# Patient Record
Sex: Male | Born: 1985 | Race: White | Hispanic: No | Marital: Married | Smoking: Former smoker
Health system: Southern US, Community
[De-identification: ages and names within clinical notes are randomized; demographics above are authoritative.]

---
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
## 2011-08-06 ENCOUNTER — Ambulatory Visit
Admission: RE | Admit: 2011-08-06 | Discharge: 2011-08-06 | Disposition: A | Discharge status: Home / Self Care | Payer: Federal, State, Local not specified - PPO | Source: Ambulatory Visit | Attending: Family Medicine | Admitting: Family Medicine

## 2011-08-06 ENCOUNTER — Other Ambulatory Visit: Payer: Self-pay | Admitting: Family Medicine

## 2011-08-06 DIAGNOSIS — R519 Headache, unspecified: Secondary | ICD-10-CM

## 2011-08-06 MED ORDER — GADOBENATE DIMEGLUMINE 529 MG/ML IV SOLN
15.0000 mL | Freq: Once | INTRAVENOUS | Status: AC | PRN
  Administered 2011-08-06: 15 mL via INTRAVENOUS

## 2014-03-15 ENCOUNTER — Ambulatory Visit
Admission: RE | Admit: 2014-03-15 | Discharge: 2014-03-15 | Disposition: A | Discharge status: Home / Self Care | Payer: Federal, State, Local not specified - PPO | Source: Ambulatory Visit | Attending: Family Medicine | Admitting: Family Medicine

## 2014-03-15 ENCOUNTER — Other Ambulatory Visit: Payer: Self-pay | Admitting: Family Medicine

## 2014-03-15 DIAGNOSIS — M549 Dorsalgia, unspecified: Secondary | ICD-10-CM

## 2015-06-15 ENCOUNTER — Ambulatory Visit
Admission: RE | Admit: 2015-06-15 | Discharge: 2015-06-15 | Disposition: A | Discharge status: Home / Self Care | Payer: Federal, State, Local not specified - PPO | Source: Ambulatory Visit | Attending: Family Medicine | Admitting: Family Medicine

## 2015-06-15 ENCOUNTER — Other Ambulatory Visit: Payer: Self-pay | Admitting: Family Medicine

## 2015-06-15 DIAGNOSIS — R52 Pain, unspecified: Secondary | ICD-10-CM

## 2015-06-15 DIAGNOSIS — M79632 Pain in left forearm: Secondary | ICD-10-CM

## 2016-08-29 ENCOUNTER — Other Ambulatory Visit: Payer: Self-pay | Admitting: Family Medicine

## 2016-08-29 ENCOUNTER — Ambulatory Visit
Admission: RE | Admit: 2016-08-29 | Discharge: 2016-08-29 | Disposition: A | Discharge status: Home / Self Care | Payer: Managed Care, Other (non HMO) | Source: Ambulatory Visit | Attending: Family Medicine | Admitting: Family Medicine

## 2016-08-29 DIAGNOSIS — R0602 Shortness of breath: Secondary | ICD-10-CM

## 2016-10-17 ENCOUNTER — Ambulatory Visit (INDEPENDENT_AMBULATORY_CARE_PROVIDER_SITE_OTHER): Payer: Managed Care, Other (non HMO) | Admitting: Internal Medicine

## 2016-10-17 ENCOUNTER — Encounter: Payer: Self-pay | Admitting: Internal Medicine

## 2016-10-17 ENCOUNTER — Other Ambulatory Visit (INDEPENDENT_AMBULATORY_CARE_PROVIDER_SITE_OTHER): Payer: Managed Care, Other (non HMO)

## 2016-10-17 VITALS — BP 112/70 | HR 78 | Ht 71.0 in | Wt 157.8 lb

## 2016-10-17 DIAGNOSIS — J45991 Cough variant asthma: Secondary | ICD-10-CM | POA: Diagnosis not present

## 2016-10-17 LAB — CBC WITH DIFFERENTIAL/PLATELET
BASOS ABS: 0 10*3/uL (ref 0.0–0.1)
Basophils Relative: 0.6 % (ref 0.0–3.0)
EOS ABS: 0.4 10*3/uL (ref 0.0–0.7)
Eosinophils Relative: 6.8 % — ABNORMAL HIGH (ref 0.0–5.0)
HEMATOCRIT: 46.7 % (ref 39.0–52.0)
Hemoglobin: 15.8 g/dL (ref 13.0–17.0)
Lymphocytes Relative: 40.9 % (ref 12.0–46.0)
Lymphs Abs: 2.6 10*3/uL (ref 0.7–4.0)
MCHC: 33.9 g/dL (ref 30.0–36.0)
MCV: 88.9 fl (ref 78.0–100.0)
MONOS PCT: 7.4 % (ref 3.0–12.0)
Monocytes Absolute: 0.5 10*3/uL (ref 0.1–1.0)
NEUTROS ABS: 2.8 10*3/uL (ref 1.4–7.7)
Neutrophils Relative %: 44.3 % (ref 43.0–77.0)
PLATELETS: 220 10*3/uL (ref 150.0–400.0)
RBC: 5.25 Mil/uL (ref 4.22–5.81)
RDW: 12.4 % (ref 11.5–15.5)
WBC: 6.3 10*3/uL (ref 4.0–10.5)

## 2016-10-17 LAB — NITRIC OXIDE: Nitric Oxide: 135

## 2016-10-17 MED ORDER — BUDESONIDE-FORMOTEROL FUMARATE 160-4.5 MCG/ACT IN AERO: 2.0000 | INHALATION_SPRAY | Freq: Two times a day (BID) | RESPIRATORY_TRACT | 11 refills | Status: DC

## 2016-10-17 MED ORDER — ALBUTEROL SULFATE HFA 108 (90 BASE) MCG/ACT IN AERS: INHALATION_SPRAY | RESPIRATORY_TRACT | 1 refills | Status: DC

## 2016-10-17 NOTE — Progress Notes (Signed)
   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 Pulmonary Consult    Referred by [name], PA.  Pt c/o SOB off and on since Aug 2017- sometimes wakes up in the night feeling SOB.  He has also noticed a decline in his exercise tolerance. He has occ cough with green sputum.    sob onset was insidious summer of 2017 noted with chasing kids around playing laser tag  Better p brother's inhaler when wakes up at night with spells of noct cough/sob > mucus variably sltly green   No obvious other patterns in day to day or daytime variabilty or assoc chronic cough or cp or chest tightness, subjective wheeze overt hb symptoms. No unusual exp hx or h/o childhood pna/ asthma or knowledge of premature birth.  Also denies any obvious fluctuation of symptoms with weather or environmental changes or other aggravating or alleviating factors except as outlined above   Current Medications, Allergies, Complete Past Medical History, Past Surgical History, Family History, and Social History were reviewed in [facility] electronic medical record.              Review of Systems  Constitutional: Negative for activity change, appetite change, chills, fever and unexpected weight change.  HENT: Negative for congestion, dental problem, postnasal drip, rhinorrhea, sneezing, sore throat, trouble swallowing and voice change.   Eyes: Negative for visual disturbance.  Respiratory: Positive for cough and shortness of breath. Negative for choking.   Cardiovascular: Negative for chest  pain and leg swelling.  Gastrointestinal: Negative for abdominal pain, nausea and vomiting.  Genitourinary: Negative for difficulty urinating.  Musculoskeletal: Negative for arthralgias.  Skin: Negative for rash.  Psychiatric/Behavioral: Negative for behavioral problems and confusion.       Objective:   Physical Exam  Pleasant amb wm nad  Wt Readings from Last 3 Encounters:  10/17/16 157 lb 12.8 oz (71.6 kg)    Vital signs reviewed - Note on arrival 02 sats  98% on RA    HEENT: nl dentition, and oropharynx. Nl external ear canals without cough reflex- moderate bilateral non-specific turbinate edema     NECK :  without JVD/Nodes/TM/ nl carotid upstrokes bilaterally   LUNGS: no acc muscle use,  Nl contour chest which is clear to A and P bilaterally without cough on insp or exp maneuvers   CV:  RRR  no s3 or murmur or increase in P2, nad no edema   ABD:  soft and nontender with nl inspiratory excursion in the supine position. No bruits or organomegaly appreciated, bowel sounds nl  MS:  Nl gait/ ext warm without deformities, calf tenderness, cyanosis or clubbing No obvious joint restrictions   SKIN: warm and dry without lesions    NEURO:  alert, approp, nl sensorium with  no motor or cerebellar deficits apparent.     Labs ordered 10/17/2016   Allergy profile       Assessment & Plan:

## 2016-10-17 NOTE — Patient Instructions (Addendum)
Please remember to go to the lab  department downstairs for your tests - we will call you with the results when they are available.  Plan A = Automatic = symbicort 160 Take 2 puffs first thing in am and then another 2 puffs about 12 hours later.   Work on inhaler technique:  relax and gently blow all the way out then take a nice smooth deep breath back in, triggering the inhaler at same time you start breathing in.  Hold for up to 5 seconds if you can. Blow out thru nose. Rinse and gargle with water when done      Plan B = Backup Only use your albuterol as a rescue medication to be used if you can't catch your breath by resting or doing a relaxed purse lip breathing pattern.  - The less you use it, the better it will work when you need it. - Ok to use the inhaler up to 2 puffs  every 4 hours if you must but call for appointment if use goes up over your usual need - Don't leave home without it !!  (think of it like the spare tire for your car)     Please schedule a follow up office visit in 6 weeks, call sooner if needed

## 2016-10-17 NOTE — Assessment & Plan Note (Addendum)
-   FENO 10/17/2016  =   135  - Allergy profile 10/17/2016 >  Eos 0. 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  Please see AVS from this visit for a full list of these instructions

## 2016-10-18 LAB — RESPIRATORY ALLERGY PROFILE REGION II ~~LOC~~
ALLERGEN, COMM SILVER BIRCH, T3: 0.11 kU/L — AB
ALLERGEN, MULBERRY, T70: 0.13 kU/L — AB
Allergen, A. alternata, m6: <0.1 kU/L
Allergen, C. Herbarum, M2: <0.1 kU/L
Allergen, Cottonwood, t14: 0.2 kU/L — ABNORMAL HIGH
Allergen, D pternoyssinus,d7: 3.96 kU/L — ABNORMAL HIGH
Allergen, Oak,t7: 0.44 kU/L — ABNORMAL HIGH
Allergen, P. notatum, m1: <0.1 kU/L
Aspergillus fumigatus, m3: <0.1 kU/L
Bermuda Grass: <0.1 kU/L
Box Elder IgE: 0.16 kU/L — ABNORMAL HIGH
Cat Dander: 10.8 kU/L — ABNORMAL HIGH
Cockroach: <0.1 kU/L
Common Ragweed: <0.1 kU/L
D. farinae: 5.11 kU/L — ABNORMAL HIGH
DOG DANDER: 3.49 kU/L — AB
ELM IGE: 0.18 kU/L — AB
IGE (IMMUNOGLOBULIN E), SERUM: 162 kU/L — AB (ref <115)
Johnson Grass: <0.1 kU/L
Pecan/Hickory Tree IgE: 9.44 kU/L — ABNORMAL HIGH
Rough Pigweed  IgE: <0.1 kU/L
Sheep Sorrel IgE: 0.18 kU/L — ABNORMAL HIGH
Timothy Grass: <0.1 kU/L

## 2016-10-19 ENCOUNTER — Telehealth: Payer: Self-pay | Admitting: Internal Medicine

## 2016-10-19 NOTE — Progress Notes (Signed)
LMTCB

## 2016-10-19 NOTE — Telephone Encounter (Signed)
Nyoka CowdenMichael B Wert, MD sent to Christen ButterLeslie M Patsy Varma, CMA        Call patient : Studies are c/w multiple allergies esp dust, cat dog, trees. Be sure patient has f/u ov so we can go over all the details of this study and get a plan together moving forward - ok to move up f/u if not feeling better and wants to be seen sooner     Spoke with pt and notified of results per Dr. Sherene SiresWert. Pt verbalized understanding and denied any questions.

## 2016-10-19 NOTE — Progress Notes (Signed)
Spoke with pt and notified of results per Dr. Wert. Pt verbalized understanding and denied any questions. 

## 2016-10-25 ENCOUNTER — Telehealth: Payer: Self-pay | Admitting: Internal Medicine

## 2016-10-25 NOTE — Telephone Encounter (Signed)
PA initiated in cover my meds for the symbicort  Key  [id number] Name   Shawn Marquez DOB  26-Oct-1986  Will forward to leslie to follow up on PA.

## 2016-10-31 MED ORDER — FLUTICASONE-SALMETEROL 115-21 MCG/ACT IN AERO: 2.0000 | INHALATION_SPRAY | Freq: Two times a day (BID) | RESPIRATORY_TRACT | 12 refills | Status: DC

## 2016-10-31 MED ORDER — FLUTICASONE-SALMETEROL 115-21 MCG/ACT IN AERO: 2.0000 | INHALATION_SPRAY | Freq: Two times a day (BID) | RESPIRATORY_TRACT | 0 refills | Status: DC

## 2016-10-31 NOTE — Telephone Encounter (Signed)
Spoke with pt, aware of rx change.  Sample left up front, rx sent to pharmacy.  Nothing further needed.

## 2016-10-31 NOTE — Telephone Encounter (Signed)
Received fax stating that the Symbicort is not covered  Per MW- switch to Advair HFA 115 mg 2 puffs bid  LMTCB

## 2016-10-31 NOTE — Telephone Encounter (Signed)
Pt retuening call.Shawn Marquez ° °

## 2016-11-30 ENCOUNTER — Ambulatory Visit: Payer: Managed Care, Other (non HMO) | Admitting: Internal Medicine

## 2016-12-03 ENCOUNTER — Ambulatory Visit: Payer: Managed Care, Other (non HMO) | Admitting: Internal Medicine

## 2016-12-12 ENCOUNTER — Encounter: Payer: Self-pay | Admitting: Internal Medicine

## 2016-12-12 ENCOUNTER — Ambulatory Visit (INDEPENDENT_AMBULATORY_CARE_PROVIDER_SITE_OTHER): Payer: Managed Care, Other (non HMO) | Admitting: Internal Medicine

## 2016-12-12 VITALS — BP 120/70 | HR 83 | Ht 71.0 in | Wt 156.2 lb

## 2016-12-12 DIAGNOSIS — J45991 Cough variant asthma: Secondary | ICD-10-CM

## 2016-12-12 MED ORDER — BUDESONIDE-FORMOTEROL FUMARATE 160-4.5 MCG/ACT IN AERO: INHALATION_SPRAY | RESPIRATORY_TRACT | 11 refills | Status: DC

## 2016-12-12 NOTE — Assessment & Plan Note (Addendum)
-  FENO 10/17/2016  =  135  - Allergy profile 10/17/2016 >  Eos 0.4 /  IgE  162 pos dust cat > dog, trees  - Spirometry 10/17/2016  FEV1 3.61 (78%)  Ratio 68  - 10/17/2016  After extensive coaching HFA effectiveness =    90% > symbicort 160 2bid  > symptoms   Despite very high initial FENO and elevated IgE and Eos >> all  goals of chronic asthma control met including optimal function and elimination of symptoms with minimal need for rescue therapy.  Contingencies discussed in full including contacting this office immediately if not controlling the symptoms using the rule of two's.     After allergy season ok to drop the symbicort back to 160 x 2 each am   I had an extended discussion with the patient reviewing all relevant studies completed to date and  lasting 15 to 20 minutes of a 25 minute visit    Formulary restrictions will be an ongoing challenge for the forseable future and I would be happy to pick an alternative if the pt will first  provide me a list of them but pt  will need to return here for training for any new device that is required eg dpi vs hfa vs respimat.    In meantime we can always provide samples so the patient never runs out of any needed respiratory medications.   Each maintenance medication was reviewed in detail including most importantly the difference between maintenance and prns and under what circumstances the prns are to be triggered using an action plan format that is not reflected in the computer generated alphabetically organized AVS.    Please see AVS for specific instructions unique to this visit that I personally wrote and verbalized to the the pt in detail and then reviewed with pt  by my nurse highlighting any  changes in therapy recommended at today's visit to their plan of care.

## 2016-12-12 NOTE — Addendum Note (Signed)
Addended by: Christen ButterASKIN, Halsey Persaud M on: 12/12/2016 04:30 PM   Modules accepted: Orders

## 2016-12-12 NOTE — Progress Notes (Signed)
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 Pulmonary Consult    Referred by Shawn MarshallAnna Becker, PA.  Pt c/o SOB off and on since Aug 2017- sometimes wakes up in the night feeling SOB.  He has also noticed a decline in his exercise tolerance. He has occ cough with green sputum.    sob onset was insidious summer of 2017 noted with chasing kids around playing laser tag  Better p brother's inhaler when wakes up at night with spells of noct cough/sob > mucus variably sltly green  rec Please remember to go to the lab  department downstairs for your tests - we will call you with the results when they are available. Plan A = Automatic = symbicort 160 Take 2 puffs first thing in am and then another 2 puffs about 12 hours later.  Work on inhaler technique:  Plan B = Backup Only use your albuterol as a rescue medication    12/12/2016  f/u ov/Shawn Marquez re:  Chronic asthma / pos atopy  Chief Complaint  Patient presents with  . Follow-up    f/u cough, states he is doing better, no conerns at this time, feels symbicort is helping    Not limited by breathing from desired activities  Including aerobics/ no cough / no noct symptoms or discolored am sputum  No obvious day to day or daytime variability or assoc  mucus plugs or hemoptysis or cp or chest tightness, subjective wheeze or overt sinus or hb symptoms. No unusual exp hx or h/o childhood pna/ asthma or knowledge of premature birth.  Sleeping ok without nocturnal  or early am  exacerbation  of respiratory  c/o's or need for noct saba. Also denies any obvious fluctuation of symptoms with weather or environmental changes or other aggravating or alleviating factors except as outlined above   Current Medications, Allergies, Complete Past Medical History, Past Surgical History, Family History, and Social History were reviewed in [facility] electronic medical record.  ROS  The following are not active complaints unless bolded sore throat, dysphagia, dental problems, itching, sneezing,  nasal congestion or excess/ purulent secretions, ear ache,   fever, chills, sweats, unintended wt loss, classically pleuritic or exertional cp,  orthopnea pnd or leg swelling, presyncope, palpitations, abdominal pain, anorexia, nausea, vomiting, diarrhea  or change in bowel or bladder habits, change in stools or urine, dysuria,hematuria,  rash, arthralgias, visual complaints, headache, numbness, weakness or ataxia or problems with walking or coordination,  change in mood/affect or memory.            Objective:   Physical Exam  Pleasant amb wm nad  Wt Readings from Last 3 Encounters:  12/12/16 156 lb 3.2 oz (70.9 kg)  10/17/16 157 lb 12.8 oz (71.6 kg)    Vital signs reviewed  - Note on arrival 02 sats  94% on RA        HEENT: nl dentition, and oropharynx. Nl external ear  canals without cough reflex -  mild bilateral non-specific turbinate edema     NECK :  without JVD/Nodes/TM/ nl carotid upstrokes bilaterally   LUNGS: no acc muscle use,  Nl contour chest which is clear to A and P bilaterally without cough on insp or exp maneuvers   CV:  RRR  no s3 or murmur or increase in P2, nad no edema   ABD:  soft and nontender with nl inspiratory excursion in the supine position. No bruits or organomegaly appreciated, bowel sounds nl  MS:  Nl gait/ ext warm without deformities, calf tenderness, cyanosis or clubbing No obvious joint restrictions   SKIN: warm and dry without lesions     NEURO:  alert, approp, nl sensorium with  no motor or cerebellar deficits apparent.      I personally reviewed images and agree with radiology impression as follows:  CXR:   08/29/16 No active cardiopulmonary disease.      Assessment & Plan:   Outpatient Encounter Prescriptions as of 12/12/2016  Medication Sig  . budesonide-formoterol (SYMBICORT) 160-4.5 MCG/ACT inhaler Take 2 puffs first thing in am and then another 2 puffs about 12 hours later.  . [DISCONTINUED] budesonide-formoterol (SYMBICORT) 160-4.5 MCG/ACT inhaler Inhale 2 puffs into the lungs 2 (two) times daily.  Marland Kitchen albuterol (PROAIR HFA) 108 (90 Base) MCG/ACT inhaler 2 puffs every 4 hours as needed only  if your can't catch your breath  . [DISCONTINUED] fluticasone-salmeterol (ADVAIR HFA) 115-21 MCG/ACT inhaler Inhale 2 puffs into the lungs 2 (two) times daily.  . [DISCONTINUED] fluticasone-salmeterol (ADVAIR HFA) 115-21 MCG/ACT inhaler Inhale 2 puffs into the lungs 2 (two) times daily.   No facility-administered encounter medications on file as of 12/12/2016.

## 2016-12-12 NOTE — Patient Instructions (Addendum)
Plan A = Automatic = symbicort 160 Take 2 puffs first thing in am and then another 2 puffs about 12 hours later - if doing great ok to leave off the pm dose after spring pollen is gone    Plan B = Backup Only use your albuterol as a rescue medication to be used if you can't catch your breath by resting or doing a relaxed purse lip breathing pattern.  - The less you use it, the better it will work when you need it. - Ok to use the inhaler up to 2 puffs  every 4 hours if you must but call for appointment if use goes up over your usual need - Don't leave home without it !!  (think of it like the spare tire for your car)    If you are satisfied with your treatment plan,  let your doctor know and he/she can either refill your medications or you can return here when your prescription runs out.     If in any way you are not 100% satisfied,  please tell us.  If 100% better, tell your friends!  Pulmonary follow up is as needed

## 2017-02-26 ENCOUNTER — Telehealth: Payer: Self-pay | Admitting: Internal Medicine

## 2017-02-26 MED ORDER — BUDESONIDE-FORMOTEROL FUMARATE 160-4.5 MCG/ACT IN AERO: INHALATION_SPRAY | RESPIRATORY_TRACT | 0 refills | Status: DC

## 2017-02-26 NOTE — Telephone Encounter (Signed)
Pt requesting symbicort coupon and sample.  These have been placed up front for pickup.  Nothing further needed.

## 2018-07-22 ENCOUNTER — Other Ambulatory Visit: Payer: Self-pay | Admitting: Gastroenterology

## 2018-07-22 DIAGNOSIS — R0989 Other specified symptoms and signs involving the circulatory and respiratory systems: Secondary | ICD-10-CM

## 2018-07-22 DIAGNOSIS — R1310 Dysphagia, unspecified: Secondary | ICD-10-CM

## 2018-07-28 ENCOUNTER — Ambulatory Visit
Admission: RE | Admit: 2018-07-28 | Discharge: 2018-07-28 | Disposition: A | Discharge status: Home / Self Care | Payer: Managed Care, Other (non HMO) | Source: Ambulatory Visit | Attending: Gastroenterology | Admitting: Gastroenterology

## 2018-07-28 DIAGNOSIS — R1310 Dysphagia, unspecified: Secondary | ICD-10-CM

## 2018-07-28 DIAGNOSIS — R0989 Other specified symptoms and signs involving the circulatory and respiratory systems: Secondary | ICD-10-CM

## 2018-10-15 ENCOUNTER — Telehealth: Payer: Self-pay | Admitting: Internal Medicine

## 2018-10-15 NOTE — Telephone Encounter (Signed)
Called patient, unable to reach left message to give us a call back. 

## 2018-10-16 MED ORDER — BUDESONIDE-FORMOTEROL FUMARATE 160-4.5 MCG/ACT IN AERO: INHALATION_SPRAY | RESPIRATORY_TRACT | 0 refills | Status: DC

## 2018-10-16 NOTE — Telephone Encounter (Signed)
Spoke with the pt  He is requesting a symbicort samples  I advised will leave 1 sample but he will need ov  Appt scheduled and sample up front for pick up  Nothing further needed

## 2018-10-16 NOTE — Telephone Encounter (Signed)
Recd fax from answering service, patient returned call on 10/15/18 at 6:29 pm.  CB is [phone].

## 2018-10-21 ENCOUNTER — Ambulatory Visit: Payer: Managed Care, Other (non HMO) | Admitting: Internal Medicine

## 2018-10-23 ENCOUNTER — Ambulatory Visit: Payer: 59 | Admitting: Internal Medicine

## 2019-02-13 ENCOUNTER — Telehealth: Payer: Self-pay | Admitting: Pulmonary Disease

## 2019-02-13 NOTE — Telephone Encounter (Addendum)
Pulmonary Telephone Encounter  Shawn Marquez is a 33 year old male with cough variant asthma who calls for Symbicort refill.  He denies active exacerbation. Does report mild allergy symptoms and usually uses his Symbicort for relief during the spring season. Reports mild shortness of breath with exertion. Denies wheezing or cough.  Assessment/Plan Cough variant asthma, not in active exacerbation -Refill Symbicort -Will arrange for televisit in 2 months  Mechele Collin, M.D. [facility] Pulmonary/Critical Care Medicine Pager: [phone] After hours pager: [phone]

## 2019-02-16 ENCOUNTER — Telehealth: Payer: Self-pay | Admitting: Internal Medicine

## 2019-02-16 NOTE — Telephone Encounter (Signed)
Key: [id number] - PA Case ID: [id number] Need help? Call us at [phone]  Status  Sent to [employer]  DrugSymbicort 160-4.5MCG/ACT aerosol  [employer] ePA Form
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
## 2019-02-17 NOTE — Telephone Encounter (Addendum)
Luciano Cutter, MD  Lbpu Triage Pool; Nyoka Cowden, MD 4 days ago    Please schedule for televisit in 2 months with Dr. Sherene Sires or NP   Routing comment    Attempted to call pt to get televisit scheduled for pt but unable to reach.  Left message for pt to return call.

## 2019-02-18 NOTE — Telephone Encounter (Signed)
Per CMN, PA was denied. Denied due to not trying Advair, Breo or Dulera. After looking at his chart, he has tried and failed Advair.   A denial/appeal letter has been sent to the office. Will keep this encounter open until the letter has been received.

## 2019-02-19 NOTE — Telephone Encounter (Signed)
Returned call to patient re: Symbicort PA.  Informed patient as of 02/18/19 the insurance had denied Symbicort but an appeal was sent in late afternoon of 02/18/19 and as soon as we hear back on the appeal we will contact him.  Appeal based on previous usage of Advair which was listed as an alternative.  Patient states he just wanted an update and nothing further needed at this time.

## 2019-02-19 NOTE — Telephone Encounter (Signed)
Patient checking the status of the refill for Symbicort.  Scheduled appt on 04/27/2019.  Patient phone number is [phone].

## 2019-02-20 NOTE — Telephone Encounter (Signed)
Still have not received the denial letter. Per the website, the member will be notified first about the denial.   Left message for patient to see if he had received the letter yet.

## 2019-02-20 NOTE — Telephone Encounter (Signed)
Spoke with [employer] and was advised to re-submit the information that was not included back to [employer] and reference the denied claim. Denied claim ref # is [id number]. Will have MW sign the form and fax back to [employer].

## 2019-02-23 ENCOUNTER — Telehealth: Payer: Self-pay | Admitting: Internal Medicine

## 2019-02-23 NOTE — Telephone Encounter (Signed)
I will sign it today but in the meantime would try   notify [place] that the pharmacy is not honoring the coupon  (fax it to them if they don't already have one) guaranteeing free symbicort to those who carry commercial insurance.   The number to call is [phone] and give them the pharmacy name and whatever other info they need to fix the problem / honor the zero coupon guarantee.

## 2019-02-23 NOTE — Telephone Encounter (Signed)
Callback to patient after he left message regarding the [place] zero coupon.  Patient states the [place] zero co-pay coupon for Symbicort 160 was presented to the [facility] pharmacy and accepted but it is still pending the PA process.  Patient states the pharmacy confirmed the co-pay would be zero but the PA decision would have to be determined before they could fill the Symbicort with this discount.  Advised we would update patient with PA process once a determination has been made.  Patient acknowledged understanding.  Contacted [place] at [phone] and spoke to [place] who confirmed the PA must be either decline or approved before the patient will be able to use the coupon.  If the patient PA is approved, the patient can fill Symbicort without a co-pay.  If the patient is denied, the patient must call [phone] to request further patient assistance.    Will await outcome of PA and update patient once decision is made. See encounter note dated 02/16/19 for more info on this.

## 2019-02-23 NOTE — Telephone Encounter (Signed)
Dr. Sherene Sires, please advise if you have been able to sign the form from pt's insurance yet on the PA for Symbicort? Thanks!

## 2019-02-23 NOTE — Telephone Encounter (Signed)
Contacted patient by phone to update him that Dr. Sherene Sires is actively working on his PA information.  Also, asked patient what occurred when he tried the coupon for the free Symbicort.  Patient states he is not sure if he actually tried to use it because he works at [employer] and the store had a coupon that they tried to use which offered him $20 off.  He is going to go back to pharmacy on his break to see if he can try to use the other coupon for the zero guarantee from [place].    Patient states he will call back and leave message once he has tried the coupon. Explained to patient that Dr. Sherene Sires wanted [place] to follow up with [place] if coupon didn't work and we needed to know what happened during the encounter using this zero pay coupon.  Patient will update [place] when he has more information.

## 2019-02-24 ENCOUNTER — Telehealth: Payer: Self-pay

## 2019-02-24 NOTE — Telephone Encounter (Signed)
PA submitted via cover my meds.   Key: [id number] PA Case ID: [id number] Rx #: [id number]  Will route message to nurse to hold and/or f/u once determination is made.

## 2019-02-25 NOTE — Telephone Encounter (Signed)
Patient calling to check status of the refill for Symbicort inhaler.  Patient phone number is [phone]. Would like Symbicort sample mailed to him.

## 2019-02-25 NOTE — Telephone Encounter (Signed)
Will close this encounter as this has been addressed in numerous encounters. Still awaiting results of the appeal.

## 2019-02-25 NOTE — Telephone Encounter (Signed)
The appeal form was signed and sent over on 02/20/19. Still awaiting the status of the appeal.

## 2019-02-25 NOTE — Telephone Encounter (Signed)
There is already a PA on file for patient. This has been appealed. Waiting on status of appeal. Will close this encounter.

## 2019-02-26 MED ORDER — BUDESONIDE-FORMOTEROL FUMARATE 160-4.5 MCG/ACT IN AERO: 2.0000 | INHALATION_SPRAY | Freq: Two times a day (BID) | RESPIRATORY_TRACT | 0 refills | Status: DC

## 2019-02-26 NOTE — Telephone Encounter (Signed)
Patient is returning phone call.  Patient phone number is [phone].

## 2019-02-26 NOTE — Telephone Encounter (Signed)
Contacted patient by phone regarding symbicort 160 sample which was authorized by Dr. Sherene Sires.  Informed patient we cannot mail a sample but he can pick one up at the front door foyer.  Patient agreeable to pick up and sample left at front door in bag with name on it.  Nothing further needed at this time.

## 2019-02-26 NOTE — Addendum Note (Signed)
Addended by: Karlton Lemon on: 02/26/2019 02:46 PM   Modules accepted: Orders

## 2019-02-26 NOTE — Telephone Encounter (Signed)
Due to [place] still waiting on the appeal that needed to be done for pt's Symbicort, pt is requesting a sample of Symbicort. Dr. Sherene Sires, please advise if you are okay with [place] providing pt a sample. Thanks!

## 2019-02-26 NOTE — Telephone Encounter (Signed)
That's fine

## 2019-02-26 NOTE — Telephone Encounter (Signed)
We are unable to mail a sample for pt but since MW said it was okay for [place] to provide pt a sample, we can place sample up front for pt to come pick up. Attempted to contact pt in regards to this but unable to reach. Left message for pt to return call.

## 2019-02-26 NOTE — Telephone Encounter (Signed)
Sample order entered for symbicort.

## 2019-03-02 ENCOUNTER — Telehealth: Payer: Self-pay

## 2019-03-02 NOTE — Telephone Encounter (Signed)
Will close this encounter. See encounter from 4/13.

## 2019-03-02 NOTE — Telephone Encounter (Signed)
Patient came by office to pick up a sample, requesting a update re: PA for Symbicort.

## 2019-03-31 ENCOUNTER — Telehealth: Payer: Self-pay | Admitting: Internal Medicine

## 2019-03-31 NOTE — Telephone Encounter (Signed)
Medication name and strength: Symbicort 160 Inhaler Provider: MW Pharmacy: [facility] Patient insurance ID: [id number] Phone: [phone]  Was the PA started on CMM?  Today 03/31/2019 online If yes, please enter the Key: [id number] - PA Case ID: [id number] - Rx #: [id number]  Timeframe for approval/denial: 4-6 business days for determination

## 2019-04-01 MED ORDER — BUDESONIDE-FORMOTEROL FUMARATE 160-4.5 MCG/ACT IN AERO: 2.0000 | INHALATION_SPRAY | Freq: Two times a day (BID) | RESPIRATORY_TRACT | 0 refills | Status: DC

## 2019-04-01 NOTE — Telephone Encounter (Signed)
PA has already been submitted for Symbicort 160 inhaler and denied. See telephone notes from 02/16/2019-02/24/2019. The original request had an appeal and as of 02/25/2019, we were waiting on the status of this appeal. Before I proceed with another appeal, I will check with Verlon Au and MW to see if they have received any paperwork regarding the appeal. If no paperwork has been received, will call insurance company to f/u on.   Verlon Au & MW, have you received any paperwork regarding this pt's original appeal for Symbicort 160? Thank you.

## 2019-04-01 NOTE — Telephone Encounter (Signed)
Left message for patient to call back to get scheduled.  

## 2019-04-01 NOTE — Telephone Encounter (Signed)
Contacted patient to schedule OV to discuss symbicort and breo since unable to get insurance coverage for symbicort 160.  Per Dr. Sherene Sires, patient needs OV to go over info and instruct on new inhaler (breo) use since Symbicort has not been approved by insurance.  Patient acknowledged understanding and confirmed appt for June 15 at 1:45pm with Dr. Sherene Sires.  This was the earliest appointment as patient did not want to see an NP.  Patient advised of no visitor policy and to bring all current medications.  Patient also answered negative to all covid questions today.  Will provide symbicort 160 sample today per Dr. Sherene Sires. Patient advised sample will be available at front door for pick up.

## 2019-04-01 NOTE — Telephone Encounter (Signed)
He has to try breo 100 first but that will need ov to teach so give sample of symbicort 160 until he can schedule ov with me or NP

## 2019-04-01 NOTE — Telephone Encounter (Signed)
This pt has not been seen by me in over two years so my suggestion is give him a sample and/ or have him see NP with formulary options in hand before he runs out of his supply

## 2019-04-01 NOTE — Telephone Encounter (Signed)
Appeal received and placed in Dr Thurston Hole lookat

## 2019-04-09 ENCOUNTER — Telehealth: Payer: Self-pay

## 2019-04-09 NOTE — Telephone Encounter (Signed)
PA started on cover my meds. Key:[id number] From [facility] [address] [id number] Will take 24-72 hours

## 2019-04-20 ENCOUNTER — Other Ambulatory Visit: Payer: Self-pay

## 2019-04-20 ENCOUNTER — Ambulatory Visit: Payer: 59 | Admitting: Internal Medicine

## 2019-04-20 ENCOUNTER — Encounter: Payer: Self-pay | Admitting: Internal Medicine

## 2019-04-20 DIAGNOSIS — J45991 Cough variant asthma: Secondary | ICD-10-CM

## 2019-04-20 MED ORDER — MOMETASONE FURO-FORMOTEROL FUM 200-5 MCG/ACT IN AERO: 2.0000 | INHALATION_SPRAY | Freq: Two times a day (BID) | RESPIRATORY_TRACT | 0 refills | Status: DC

## 2019-04-20 MED ORDER — DULERA 200-5 MCG/ACT IN AERO: INHALATION_SPRAY | RESPIRATORY_TRACT | 11 refills | Status: DC

## 2019-04-20 NOTE — Progress Notes (Signed)
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 Pulmonary Consult    Referred by Marilynne Drivers, PA.  Pt c/o SOB off and on since Aug 2017- sometimes wakes up in the night feeling SOB.  He has also noticed a decline in his exercise tolerance. He has occ cough with green sputum.    sob onset was insidious summer of 2017 noted with chasing kids around playing laser tag  Better p brother's inhaler when wakes up at night with spells of noct cough/sob > mucus variably sltly green  rec Plan A = Automatic = symbicort 160 Take 2 puffs first thing in am and then another 2 puffs about 12 hours later.  Work on inhaler technique:  Plan B = Backup Only use your albuterol as a rescue medication    12/12/2016  f/u ov/[facility] re:  Chronic asthma / positive  atopy  Chief Complaint  Patient presents with  . Follow-up    f/u cough, states he is doing better, no conerns at this time, feels symbicort is helping  Not limited by breathing from desired activities  Including aerobics/ no cough / no noct symptoms or discolored am sputum rec Plan A = Automatic = symbicort 160 Take 2 puffs first thing in am and then another 2 puffs about 12 hours later - if doing great ok to leave off the pm dose after spring pollen is gone   Plan B = Backup Only use your albuterol as a rescue medication    04/20/2019  f/u ov/[facility] re: cough variant asthma not consistent  with meds due to cost/ insurance restritions Chief Complaint  Patient presents with  . Follow-up    SOB off and on- when he first wakes up in the morning. He has been out of the symbicort for 3 days. His insurance does not cover symbicort.  Dyspnea:  As long as as taking symbicort 160 2bid does well though am prior to dosing with symbicort feels a little tight and better within a few min of symbicort rx  Cough: with laughter, sneezing> mostly dry, daytime cough, not with ex Sleeping: ok s noct awakening  SABA use: not at all  02: none    No obvious day to day or daytime variability or assoc excess/ purulent sputum or mucus plugs or hemoptysis or cp or chest tightness, subjective wheeze or overt sinus or hb symptoms.   Sleeping as above  without nocturnal   exacerbation  of respiratory  c/o's or need for noct saba. Also denies any obvious fluctuation of symptoms with weather or environmental changes or other aggravating or alleviating factors except as outlined above   No unusual exposure hx or h/o childhood pna/ asthma or knowledge of premature birth.  Current Allergies, Complete Past Medical History, Past Surgical History, Family History, and Social History were reviewed in [facility] electronic medical record.  ROS  The following are not active complaints unless bolded Hoarseness, sore throat, dysphagia, dental problems, itching, sneezing,  nasal congestion or discharge of excess mucus or purulent secretions, ear ache,   fever, chills, sweats, unintended wt loss or wt gain, classically pleuritic or exertional cp,  orthopnea pnd or arm/hand swelling  or leg swelling, presyncope, palpitations, abdominal pain, anorexia, nausea, vomiting, diarrhea  or change in bowel habits or change in bladder habits, change in stools or change in urine, dysuria, hematuria,  rash, arthralgias, visual complaints, headache, numbness, weakness or ataxia or problems with walking or coordination,  change in mood  or  memory.        Current Meds  Medication Sig  . albuterol (PROAIR HFA) 108 (90 Base) MCG/ACT inhaler 2 puffs every 4 hours as needed only  if your can't catch your breath             Objective:   Physical Exam  Pleasant amb wm nad  04/20/2019        159  12/12/16 156 lb 3.2 oz (70.9 kg)  10/17/16 157 lb 12.8 oz (71.6 kg)    Vital signs reviewed - Note on arrival 02 sats  95% on RA     HEENT: nl dentition, turbinates bilaterally, and oropharynx. Nl external ear canals without cough reflex   NECK :  without JVD/Nodes/TM/ nl carotid upstrokes bilaterally   LUNGS: no acc muscle use,  Nl contour chest which is clear to A and P bilaterally without cough on insp or exp maneuvers   CV:  RRR  no s3 or murmur or increase in P2, and no edema   ABD:  soft and nontender with nl inspiratory excursion in the supine position. No bruits or organomegaly appreciated, bowel sounds nl  MS:  Nl gait/ ext warm without deformities, calf tenderness, cyanosis or clubbing No obvious joint restrictions   SKIN: warm and dry without lesions    NEURO:  alert, approp, nl sensorium with  no motor or cerebellar deficits apparent.             Assessment & Plan:

## 2019-04-20 NOTE — Assessment & Plan Note (Signed)
Onset aug 2017  - FENO 10/17/2016  =   135  - Allergy profile 10/17/2016 >  Eos 0.4 /  IgE  162 pos dust cat > dog, trees  - Spirometry 10/17/2016  FEV1 3.61 (78%)  Ratio 68 - .04/20/2019  After extensive coaching inhaler device,  effectiveness =    75% > continue dulera 200 2bid  And add singulair if not better    DDX of  difficult airways management almost all start with A and  include Adherence, Ace Inhibitors, Acid Reflux, Active Sinus Disease, Alpha 1 Antitripsin deficiency, Anxiety masquerading as Airways dz,  ABPA,  Allergy(esp in young), Aspiration (esp in elderly), Adverse effects of meds,  Active smoking or vaping, A bunch of PE's (a small clot burden can't cause this syndrome unless there is already severe underlying pulm or vascular dz with poor reserve) plus two Bs  = Bronchiectasis and Beta blocker use..and one C= CHF   Adherence is always the initial "prime suspect" and is a multilayered concern that requires a "trust but verify" approach in every patient - starting with knowing how to use medications, especially inhalers, correctly, keeping up with refills and understanding the fundamental difference between maintenance and prns vs those medications only taken for a very short course and then stopped and not refilled.  - see hfa teaching  - insurance restrictions reviewed:  Advised:  formulary restrictions will be an ongoing challenge for the forseable future and I would be happy to pick an alternative if the pt will first  provide me a list of them -  pt  will need to return here for training for any new device that is required eg dpi vs hfa vs respimat.    In the meantime we can always provide samples so that the patient never runs out of any needed respiratory medications.   ? Allergy > note pos profile so continue high dose dulera and  add singulair next and if not better then allergy eval for immunotherapy vs biologics/ reviewed.   F/u can be yearly, sooner prn / rule of 2's  reviewed as was concept of saba nothing more than a spare tire designed to bridge the gap to  medical attention asap    I had an extended discussion with the patient reviewing all relevant studies completed to date and  lasting 15 to 20 minutes of a 25 minute visit    See device teaching which extended face to face time for this visit.  Each maintenance medication was reviewed in detail including emphasizing most importantly the difference between maintenance and prns and under what circumstances the prns are to be triggered using an action plan format that is not reflected in the computer generated alphabetically organized AVS which I have not found useful in most complex patients, especially with respiratory illnesses  Please see AVS for specific instructions unique to this visit that I personally wrote and verbalized to the the pt in detail and then reviewed with pt  by my nurse highlighting any  changes in therapy recommended at today's visit to their plan of care.

## 2019-04-20 NOTE — Patient Instructions (Addendum)
Dulera 200 Take 2 puffs first thing in am and then another 2 puffs about 12 hours later.   Work on inhaler technique:  relax and gently blow all the way out then take a nice smooth deep breath back in, triggering the inhaler at same time you start breathing in.  Hold for up to 5 seconds if you can. Blow out thru nose. Rinse and gargle with water when done   Only use your albuterol as a rescue medication to be used if you can't catch your breath by resting or doing a relaxed purse lip breathing pattern.  - The less you use it, the better it will work when you need it. - Ok to use up to 2 puffs  every 4 hours if you must but call for immediate appointment if use goes up over your usual need - Don't leave home without it !!  (think of it like the spare tire for your car)    If not happy with dulera 200 the next step would be to add singulair 10 mg each pm   Please schedule a follow up visit in  12 months but call sooner if needed

## 2019-04-21 NOTE — Telephone Encounter (Signed)
Received outcome from covermymeds in regards to the PA on Symbicort:  Outcome Deniedon June 8 Document: EPA_ETC_V 1 Decision Notes: This drug is not on our list of covered drugs, also known as a formulary. Our Coverage Determinations - Exceptions policy is used to decide if a not-covered drug can be approved. The conditions in this policy have not been met. From the records that we have received, these reasons caused the denial: 1) All covered drugs used for your health issue have not been tried and failed. Other drugs that can be used are Advair Diskus (tried), Kellogg. Please look at the formulary to see what drugs are covered. Prior authorization may be required and quantity limits may apply to covered drugs. ADDITIONAL INFORMATION FOR YOUR HEALTH CARE PROVIDER: This request has not been approved because this drug is not on formulary. An exception to allow coverage of a non-formulary drug may be granted if all conditions in our Coverage Determinations - Exceptions policy are met. From the information we have received, the member does not meet number 2 of the exception policy criteria. The reason for denial is explained to the member above. The criteria from the policy are listed here. 1) The drug is being used for a condition approved by the [place] (FDA). 2) All formulary alternatives have been tried or medical reasons have been provided why all other covered drugs cannot be tried. 3) Records have been received showing the requested drug is medically necessary. These should include relevant medical history and lab results, past treatments tried with dates of trial and responses, and any other evidence to show the covered drugs are likely to be ineffective or unsafe for the member. 4) Prescription drug samples were not used to establish treatment. Since criteria have not been met, we are unable to approve coverage for this drug at this time. Please refer to the  formulary for information on what is covered. Prior authorization may be required and quantity limits may apply to covered drugs.  Pt had OV with MW yesterday 6/15 and at this visit, pt was switched to [facility] from Symbicort. Nothing further needed.

## 2019-04-27 ENCOUNTER — Ambulatory Visit: Payer: 59 | Admitting: Internal Medicine

## 2020-04-25 ENCOUNTER — Ambulatory Visit: Payer: 59 | Admitting: Internal Medicine

## 2020-05-01 IMAGING — RF DG ESOPHAGUS
10 of 11 series · 15 of 24 positions shown · non-contrast
Comparison: None.

CLINICAL DATA: Globus sensation.

EXAM:
ESOPHOGRAM / BARIUM SWALLOW / BARIUM TABLET STUDY
TECHNIQUE: Combined double contrast and single contrast examination performed
using effervescent crystals, thick barium liquid, and thin barium
liquid. The patient was observed with fluoroscopy swallowing a 13 mm
barium sulphate tablet.
FLUOROSCOPY TIME:  Fluoroscopy Time:  0 minutes 54 seconds
Radiation Exposure Index (if provided by the fluoroscopic device):
24 mGy
Number of Acquired Spot Images: 9

[Series 1: sequence · 0.31mm/px · 2 of 5 frames shown (1 of 6)]
[frame 1/5]
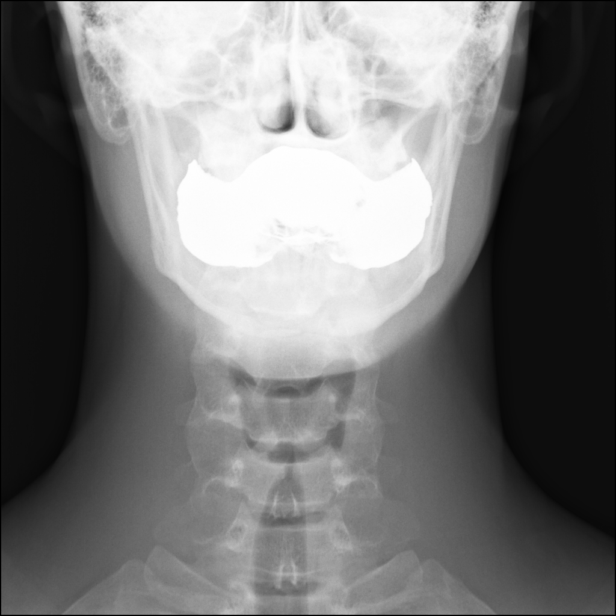
[frame 3/5]
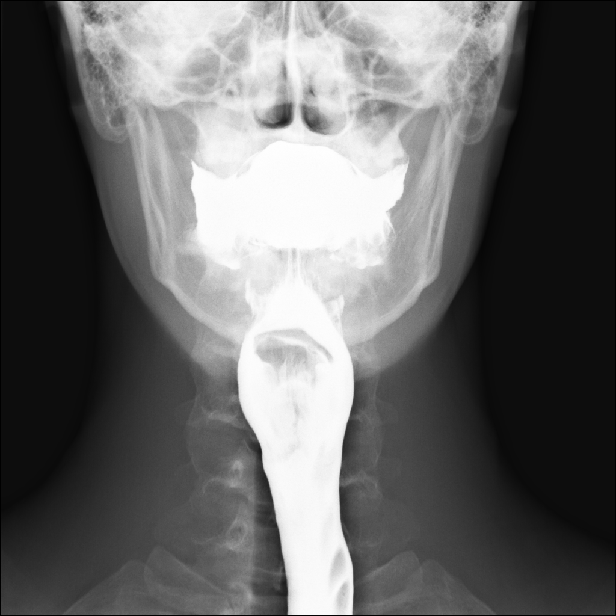

[Series 2: one shot · 1 of 1 slices shown (1 of 4)]
[im 1/1]
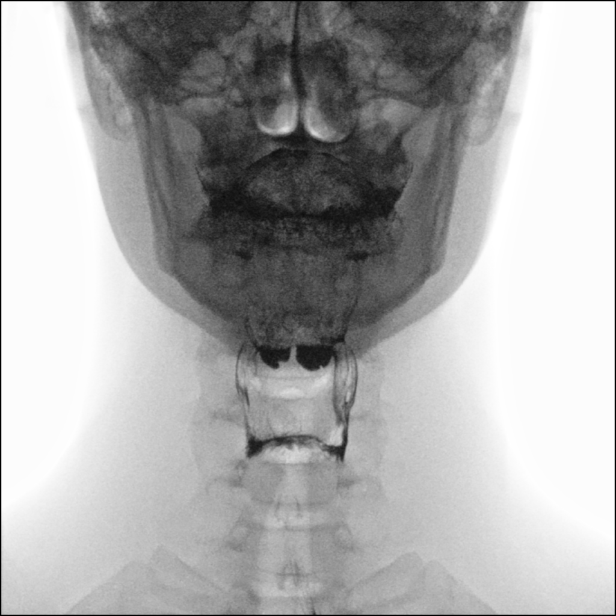

[Series 3: sequence · 0.31mm/px · 2 of 4 frames shown (2 of 6)]
[frame 1/4]
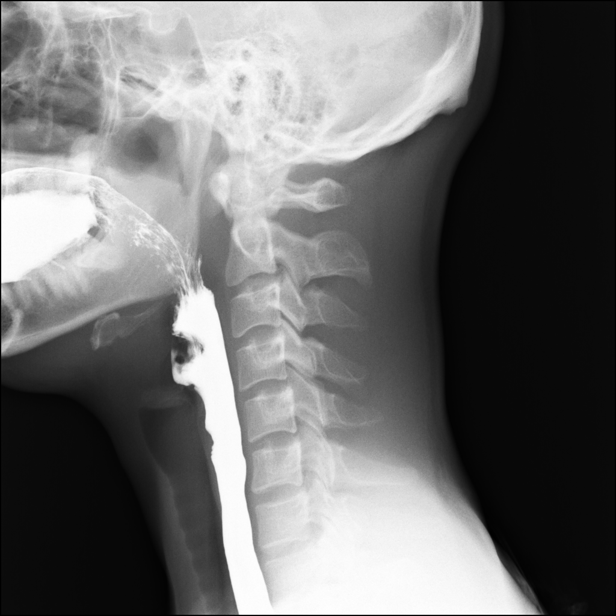
[frame 4/4]
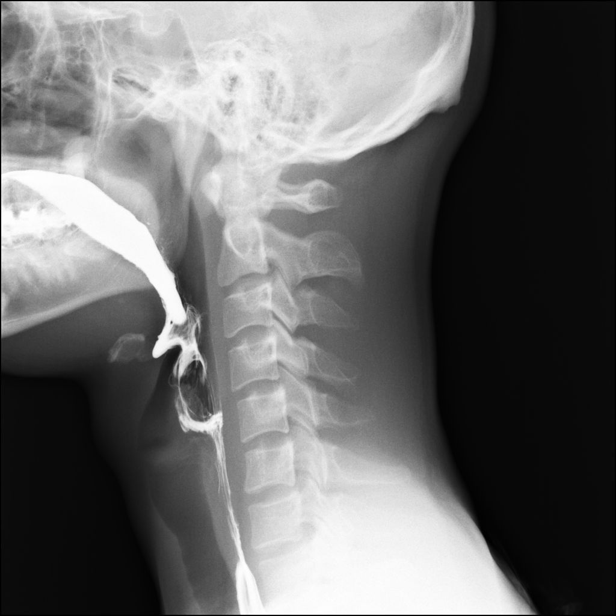

[Series 4: one shot · 2 of 4 slices shown (2 of 4)]
[im 1/4]
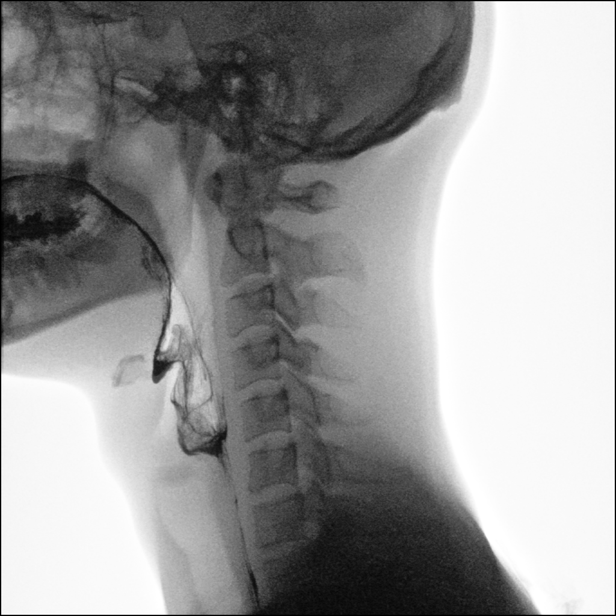
[im 3/4]
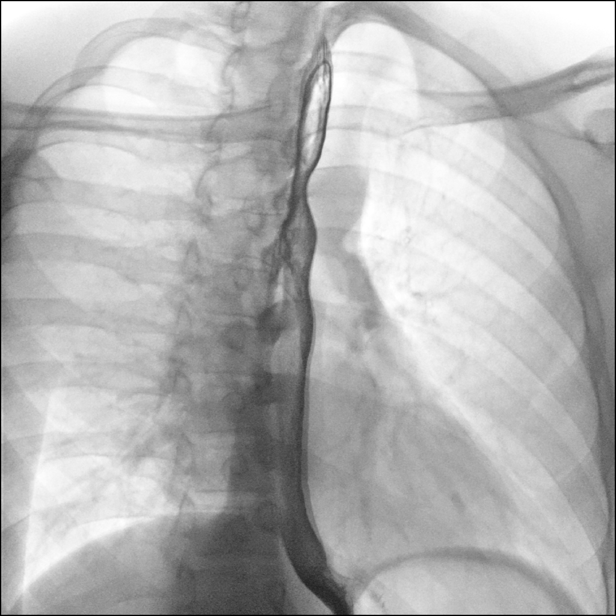

[Series 5: sequence · 1 of 1 slices shown (3 of 6)]
[im 1/1]
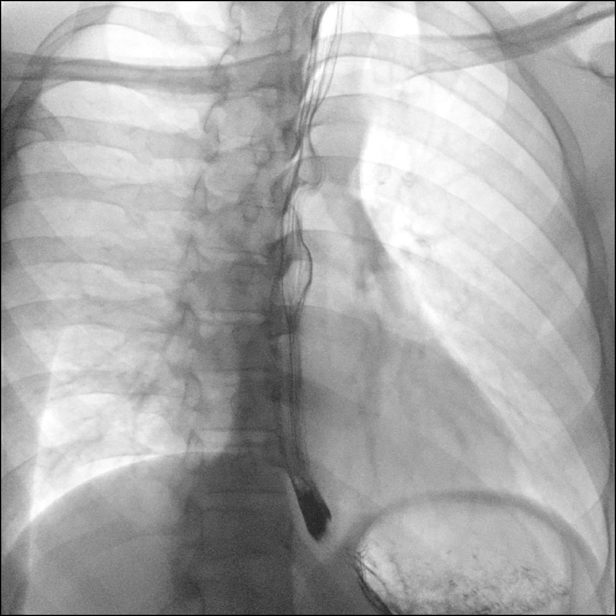

[Series 6: one shot · 1 of 1 slices shown (3 of 4)]
[im 1/1]
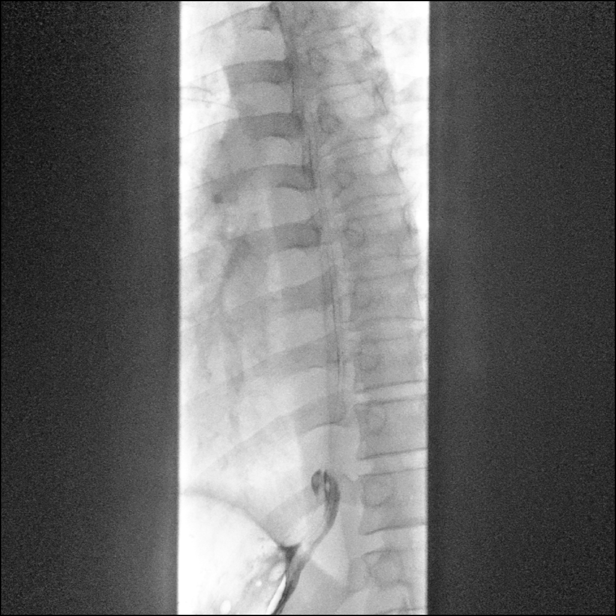

[Series 7: sequence · 1 of 2 frames shown (4 of 6)]
[frame 2/2]
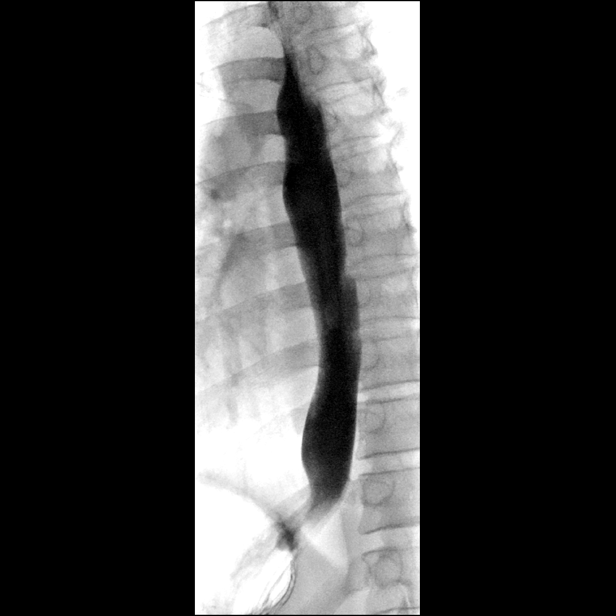

[Series 8: sequence · 2 of 6 frames shown (5 of 6)]
[frame 1/6]
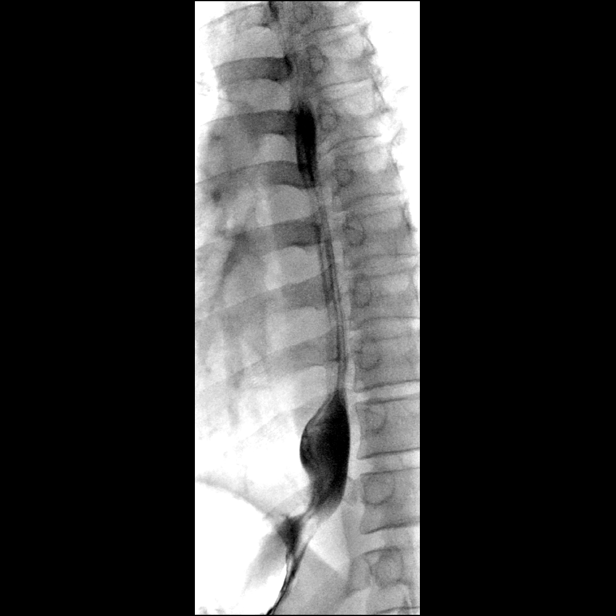
[frame 6/6]
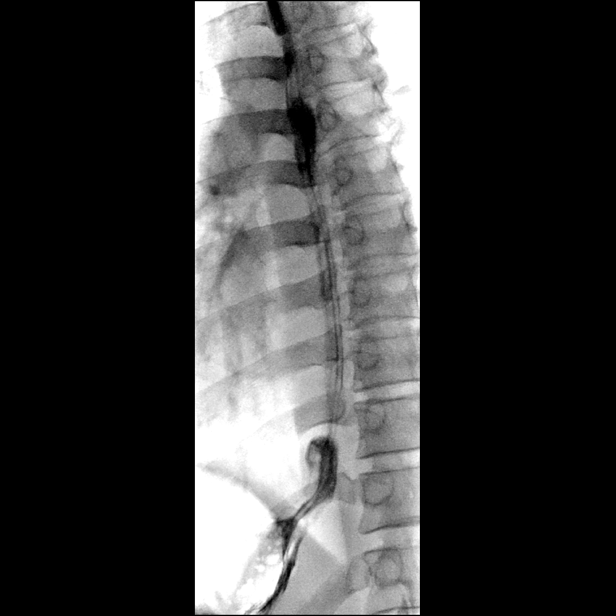

[Series 10: sequence · 2 of 5 frames shown (6 of 6)]
[frame 1/5]
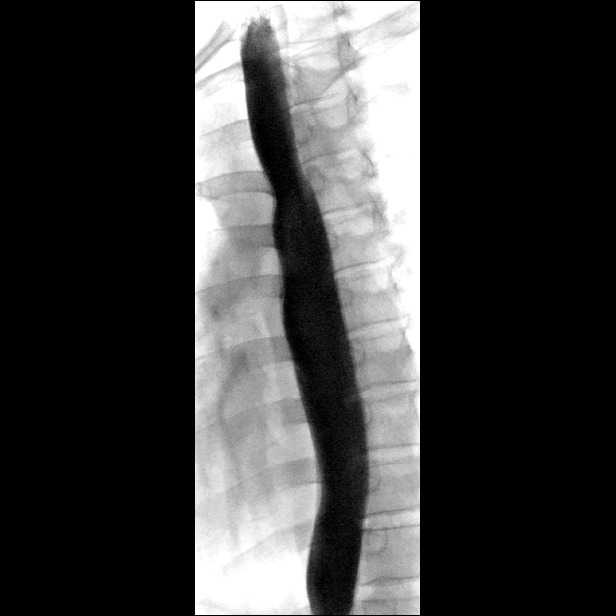
[frame 3/5]
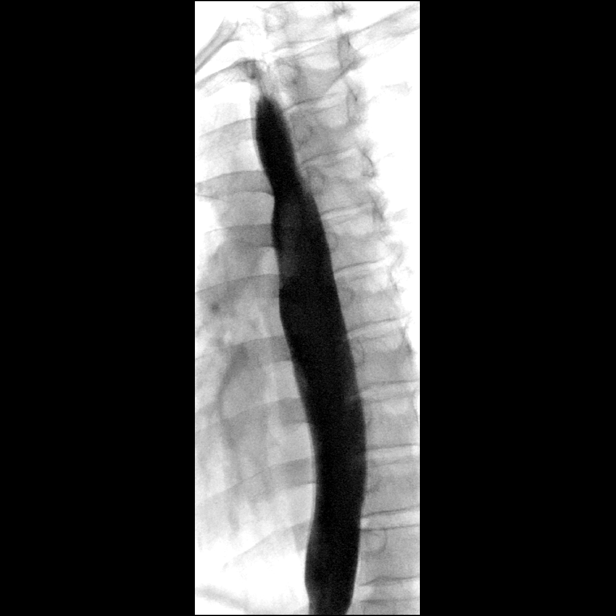

[Series 11: one shot · 1 of 1 slices shown (4 of 4)]
[im 1/1]
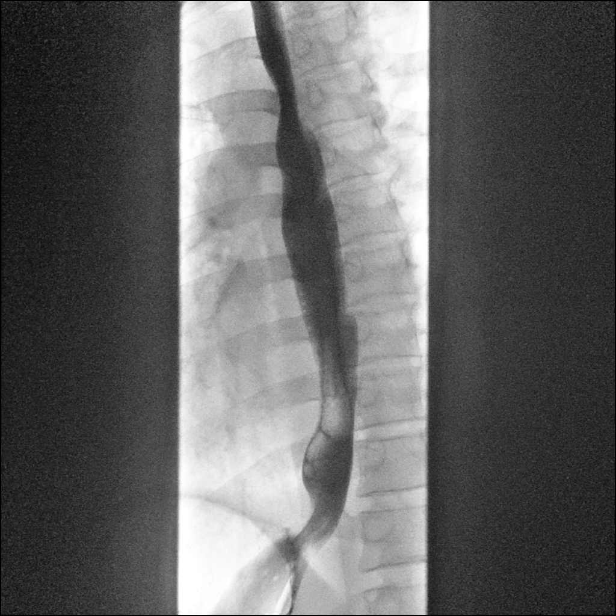

[15 of 24 positions shown; findings below may reference images not displayed]

FINDINGS: Swallowing mechanism is normal. Normal esophageal motility. No
esophageal fold thickening, stricture or obstruction. A 13 mm barium
tablet readily passed into the stomach.
IMPRESSION: Normal exam.

## 2020-05-17 ENCOUNTER — Telehealth: Payer: Self-pay | Admitting: Internal Medicine

## 2020-05-17 NOTE — Telephone Encounter (Signed)
Pt no showed for his 1 year follow up with MW 04/25/20. Pt needs an appt prior to refill.  Attempted to call pt but unable to reach. Left message for him to return call.

## 2020-05-17 NOTE — Telephone Encounter (Signed)
Pt returned missed call. Informed him that he needed to make a f/u appt. Scheduled f/u for 06/02/20. Nothing futher needed.

## 2020-06-02 ENCOUNTER — Other Ambulatory Visit: Payer: Self-pay

## 2020-06-02 ENCOUNTER — Ambulatory Visit: Payer: 59 | Admitting: Primary Care

## 2020-06-02 ENCOUNTER — Encounter: Payer: Self-pay | Admitting: Primary Care

## 2020-06-02 DIAGNOSIS — J45991 Cough variant asthma: Secondary | ICD-10-CM

## 2020-06-02 MED ORDER — DULERA 200-5 MCG/ACT IN AERO: INHALATION_SPRAY | RESPIRATORY_TRACT | 11 refills | Status: DC

## 2020-06-02 NOTE — Assessment & Plan Note (Addendum)
-   Resp allergy panel + mulitple allergens; IgE 162 - Symptoms well controlled on ICS/LABA - Refill Dulera 200-37mcg two puffs twice daily  - Discuss potentially starting Singulair 10mg  bedtime, if allergy symptoms exacerbate he will notify  - Needs annual follow-up for refills or PCP can manage if symptoms continue to be well controlled on present treatment  - Encouraged patient to get COVID-19 vaccine- he is considering

## 2020-06-02 NOTE — Patient Instructions (Addendum)
Pleasure meeting you today Shawn Marquez, [place] glad you are doing well   Recommendations: - Continue Dulera 1-2 puffs twice daily; Ventolin rescue inhaler 1-2 puffs as needed for breakthrough shortness of breath/wheezing  - Consider trying Singulair (montelukast) 10mg  at bedtime   Follow-up: - Annually with Dr. or PCP can fill Shawn Marquez    Montelukast oral tablets What is this medicine? MONTELUKAST (mon te LOO kast) is used to prevent and treat the symptoms of asthma. It is also used to treat allergies. Do not use for an acute asthma attack. This medicine may be used for other purposes; ask your health care provider or pharmacist if you have questions. COMMON BRAND NAME(S): Singulair What should I tell my health care provider before I take this medicine? They need to know if you have any of these conditions:  liver disease  an unusual or allergic reaction to montelukast, other medicines, foods, dyes, or preservatives  pregnant or trying to get pregnant  breast-feeding How should I use this medicine? This medicine should be given by mouth. Follow the directions on the prescription label. Take this medicine at the same time every day. You may take this medicine with or without meals. Do not chew the tablets. Do not stop taking your medicine unless your doctor tells you to. Talk to your pediatrician regarding the use of this medicine in children. Special care may be needed. While this drug may be prescribed for children as young as 21 years of age for selected conditions, precautions do apply. Overdosage: If you think you have taken too much of this medicine contact a poison control center or emergency room at once. NOTE: This medicine is only for you. Do not share this medicine with others. What if I miss a dose? If you miss a dose, skip it. Take your next dose at the normal time. Do not take extra or 2 doses at the same time to make up for the missed dose. What may interact with this  medicine?  anti-infectives like rifampin and rifabutin  medicines for seizures like phenytoin, phenobarbital, and carbamazepine This list may not describe all possible interactions. Give your health care provider a list of all the medicines, herbs, non-prescription drugs, or dietary supplements you use. Also tell them if you smoke, drink alcohol, or use illegal drugs. Some items may interact with your medicine. What should I watch for while using this medicine? Visit your doctor or health care professional for regular checks on your progress. Tell your doctor or health care professional if your allergy or asthma symptoms do not improve. Take your medicine even when you do not have symptoms. Do not stop taking any of your medicine(s) unless your doctor tells you to. If you have asthma, talk to your doctor about what to do in an acute asthma attack. Always have your inhaled rescue medicine for asthma attacks with you. Patients and their families should watch for new or worsening thoughts of suicide or depression. Also watch for sudden changes in feelings such as feeling anxious, agitated, panicky, irritable, hostile, aggressive, impulsive, severely restless, overly excited and hyperactive, or not being able to sleep. Any worsening of mood or thoughts of suicide or dying should be reported to your health care professional right away. What side effects may I notice from receiving this medicine? Side effects that you should report to your doctor or health care professional as soon as possible:  allergic reactions like skin rash or hives, or swelling of the face, lips,  or tongue  breathing problems  changes in emotions or moods  confusion  depressed mood  fever or infection  hallucinations  joint pain  painful lumps under the skin  pain, tingling, numbness in the hands or feet  redness, blistering, peeling, or loosening of the skin, including inside the  mouth  restlessness  seizures  sleep walking  signs and symptoms of infection like fever; chills; cough; sore throat; flu-like illness  signs and symptoms of liver injury like dark yellow or brown urine; general ill feeling or flu-like symptoms; light-colored stools; loss of appetite; nausea; right upper belly pain; unusually weak or tired; yellowing of the eyes or skin  sinus pain or swelling  stuttering  suicidal thoughts or other mood changes  tremors  trouble sleeping  uncontrolled muscle movements  unusual bleeding or bruising  vivid or bad dreams Side effects that usually do not require medical attention (report to your doctor or health care professional if they continue or are bothersome):  dizziness  drowsiness  headache  runny nose  stomach upset  tiredness This list may not describe all possible side effects. Call your doctor for medical advice about side effects. You may report side effects to FDA at 1-800-FDA-1088. Where should I keep my medicine? Keep out of the reach of children. Store at room temperature between 15 and 30 degrees C (59 and 86 degrees F). Protect from light and moisture. Keep this medicine in the original bottle. Throw away any unused medicine after the expiration date. NOTE: This sheet is a summary. It may not cover all possible information. If you have questions about this medicine, talk to your doctor, pharmacist, or health care provider.  2020 Elsevier/Gold Standard (2019-02-20 12:54:33)    Asthma, Adult  Asthma is a long-term (chronic) condition in which the airways get tight and narrow. The airways are the breathing passages that lead from the nose and mouth down into the lungs. A person with asthma will have times when symptoms get worse. These are called asthma attacks. They can cause coughing, whistling sounds when you breathe (wheezing), shortness of breath, and chest pain. They can make it hard to breathe. There is no cure  for asthma, but medicines and lifestyle changes can help control it. There are many things that can bring on an asthma attack or make asthma symptoms worse (triggers). Common triggers include:  Mold.  Dust.  Cigarette smoke.  Cockroaches.  Things that can cause allergy symptoms (allergens). These include animal skin flakes (dander) and pollen from trees or grass.  Things that pollute the air. These may include household cleaners, wood smoke, smog, or chemical odors.  Cold air, weather changes, and wind.  Crying or laughing hard.  Stress.  Certain medicines or drugs.  Certain foods such as dried fruit, potato chips, and grape juice.  Infections, such as a cold or the flu.  Certain medical conditions or diseases.  Exercise or tiring activities. Asthma may be treated with medicines and by staying away from the things that cause asthma attacks. Types of medicines may include:  Controller medicines. These help prevent asthma symptoms. They are usually taken every day.  Fast-acting reliever or rescue medicines. These quickly relieve asthma symptoms. They are used as needed and provide short-term relief.  Allergy medicines if your attacks are brought on by allergens.  Medicines to help control the body's defense (immune) system. Follow these instructions at home: Avoiding triggers in your home  Change your heating and air conditioning filter often.  Limit your use of fireplaces and wood stoves.  Get rid of pests (such as roaches and mice) and their droppings.  Throw away plants if you see mold on them.  Clean your floors. Dust regularly. Use cleaning products that do not smell.  Have someone vacuum when you are not home. Use a vacuum cleaner with a HEPA filter if possible.  Replace carpet with wood, tile, or vinyl flooring. Carpet can trap animal skin flakes and dust.  Use allergy-proof pillows, mattress covers, and box spring covers.  Wash bed sheets and blankets  every week in hot water. Dry them in a dryer.  Keep your bedroom free of any triggers.  Avoid pets and keep windows closed when things that cause allergy symptoms are in the air.  Use blankets that are made of polyester or cotton.  Clean bathrooms and kitchens with bleach. If possible, have someone repaint the walls in these rooms with mold-resistant paint. Keep out of the rooms that are being cleaned and painted.  Wash your hands often with soap and water. If soap and water are not available, use hand sanitizer.  Do not allow anyone to smoke in your home. General instructions  Take over-the-counter and prescription medicines only as told by your doctor. ? Talk with your doctor if you have questions about how or when to take your medicines. ? Make note if you need to use your medicines more often than usual.  Do not use any products that contain nicotine or tobacco, such as cigarettes and e-cigarettes. If you need help quitting, ask your doctor.  Stay away from secondhand smoke.  Avoid doing things outdoors when allergen counts are high and when air quality is low.  Wear a ski mask when doing outdoor activities in the winter. The mask should cover your nose and mouth. Exercise indoors on cold days if you can.  Warm up before you exercise. Take time to cool down after exercise.  Use a peak flow meter as told by your doctor. A peak flow meter is a tool that measures how well the lungs are working.  Keep track of the peak flow meter's readings. Write them down.  Follow your asthma action plan. This is a written plan for taking care of your asthma and treating your attacks.  Make sure you get all the shots (vaccines) that your doctor recommends. Ask your doctor about a flu shot and a pneumonia shot.  Keep all follow-up visits as told by your doctor. This is important. Contact a doctor if:  You have wheezing, shortness of breath, or a cough even while taking medicine to prevent  attacks.  The mucus you cough up (sputum) is thicker than usual.  The mucus you cough up changes from clear or white to yellow, green, gray, or bloody.  You have problems from the medicine you are taking, such as: ? A rash. ? Itching. ? Swelling. ? Trouble breathing.  You need reliever medicines more than 2-3 times a week.  Your peak flow reading is still at 50-79% of your personal best after following the action plan for 1 hour.  You have a fever. Get help right away if:  You seem to be worse and are not responding to medicine during an asthma attack.  You are short of breath even at rest.  You get short of breath when doing very little activity.  You have trouble eating, drinking, or talking.  You have chest pain or tightness.  You have a fast  heartbeat.  Your lips or fingernails start to turn blue.  You are light-headed or dizzy, or you faint.  Your peak flow is less than 50% of your personal best.  You feel too tired to breathe normally. Summary  Asthma is a long-term (chronic) condition in which the airways get tight and narrow. An asthma attack can make it hard to breathe.  Asthma cannot be cured, but medicines and lifestyle changes can help control it.  Make sure you understand how to avoid triggers and how and when to use your medicines. This information is not intended to replace advice given to you by your health care provider. Make sure you discuss any questions you have with your health care provider. Document Revised: 12/25/2018 Document Reviewed: 11/26/2016 Elsevier Patient Education  2020 [employer].

## 2020-06-02 NOTE — Progress Notes (Signed)
@Patient  ID: , male    DOB: [birth year], 34 y.o.   MRN: [id number]  Chief Complaint  Patient presents with  . 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  Patient of Dr. 2011, last seen in office on 04/20/2019.  Insurance does not cover Symbicort.  Inconsistent with medications due to cost.  06/02/2020 Patient presents today for annual follow-up for cough variant asthma. He is doing well. Ran out of Dulera and needed a refill. His symptoms are well controlled when using ICS/LABA inhaler. He reports one episode of nocturnal cough/shortness of breath when off of Dulera inhaler, needed to use his Albuterol rescue inhaler. Took him 30-33min to recover. Heat and humidity affect his asthma symptoms. His respiratory allergy panel was positive for multiple allergens. IgE 162. He has a dog and cat at home. Discussed possible trial of Singulair, he will let our office know if allergy symptoms exacerbate.   Significant testing:  - FENO 10/17/2016  =   135  - Allergy profile 10/17/2016 >  Eos 0.4 /  IgE  162 pos dust cat > dog, trees  - Spirometry 10/17/2016  FEV1 3.61 (78%)  Ratio 68   No Known Allergies   There is no immunization history on file for this patient.  History reviewed. No pertinent past medical history.  Tobacco History: Social History   Tobacco Use  Smoking Status Former  Smoker  . Packs/day: 0.25  . Years: 3.00  . Pack years: 0.75  . Types: Cigarettes  . Quit date: 11/05/2008  . Years since quitting: 11.5  Smokeless Tobacco Never Used   Counseling given: Not Answered   Outpatient Medications Prior to Visit  Medication Sig Dispense Refill  . albuterol (PROAIR HFA) 108 (90 Base) MCG/ACT inhaler 2 puffs every 4 hours as needed only  if your can't catch your breath 1 Inhaler 1  . mometasone-formoterol (DULERA) 200-5 MCG/ACT AERO Take 2 puffs first thing in am and then another 2 puffs about 12 hours later. 1 Inhaler 11   No facility-administered medications prior to visit.    Review of Systems  Review of Systems  Constitutional: Negative.   Respiratory: Negative for chest tightness, shortness of breath and wheezing.        Nocturnal cough/shortness of breath  Cardiovascular: Negative.    Physical Exam  BP (!) 110/60 (BP Location: Left Arm, Cuff Size: Normal)   Pulse 57   Temp (!) 97.3 F (36.3 C) (Oral)   Ht 5\' 11"  (1.803 m)   Wt 163 lb 12.8 oz (74.3 kg)   SpO2 96%   BMI 22.85 kg/m  Physical Exam Constitutional:      Appearance: Normal appearance.  Cardiovascular:     Rate and Rhythm: Normal rate and regular rhythm.  Pulmonary:     Effort: Pulmonary effort is normal.     Breath  sounds: Normal breath sounds. No wheezing.  Skin:    General: Skin is warm and dry.  Neurological:     General: No focal deficit present.     Mental Status: He is alert and oriented to person, place, and time. Mental status is at baseline.  Psychiatric:        Mood and Affect: Mood normal.        Behavior: Behavior normal.        Thought Content: Thought content normal.        Judgment: Judgment normal.      Lab Results:  CBC    Component Value Date/Time   WBC 6.3 10/17/2016 1721   RBC 5.25 10/17/2016 1721   HGB 15.8 10/17/2016 1721   HCT 46.7 10/17/2016 1721   PLT 220.0 10/17/2016 1721   MCV 88.9 10/17/2016 1721   MCHC 33.9 10/17/2016 1721    RDW 12.4 10/17/2016 1721   LYMPHSABS 2.6 10/17/2016 1721   MONOABS 0.5 10/17/2016 1721   EOSABS 0.4 10/17/2016 1721   BASOSABS 0.0 10/17/2016 1721    BMET No results found for: NA, K, CL, CO2, GLUCOSE, BUN, CREATININE, CALCIUM, GFRNONAA, GFRAA  BNP No results found for: BNP  ProBNP No results found for: PROBNP  Imaging: No results found.   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

## 2021-03-03 ENCOUNTER — Other Ambulatory Visit: Payer: Self-pay | Admitting: Family Medicine

## 2021-03-03 DIAGNOSIS — R1032 Left lower quadrant pain: Secondary | ICD-10-CM

## 2021-03-10 ENCOUNTER — Other Ambulatory Visit: Payer: 59

## 2021-03-20 ENCOUNTER — Ambulatory Visit
Admission: RE | Admit: 2021-03-20 | Discharge: 2021-03-20 | Disposition: A | Discharge status: Home / Self Care | Payer: 59 | Source: Ambulatory Visit | Attending: Family Medicine | Admitting: Family Medicine

## 2021-03-20 DIAGNOSIS — R1032 Left lower quadrant pain: Secondary | ICD-10-CM

## 2021-03-20 MED ORDER — IOPAMIDOL (ISOVUE-300) INJECTION 61%
100.0000 mL | Freq: Once | INTRAVENOUS | Status: AC | PRN
  Administered 2021-03-20: 100 mL via INTRAVENOUS

## 2021-08-22 ENCOUNTER — Other Ambulatory Visit: Payer: Self-pay | Admitting: Primary Care

## 2021-09-01 ENCOUNTER — Telehealth: Payer: Self-pay | Admitting: Internal Medicine

## 2021-09-01 MED ORDER — DULERA 200-5 MCG/ACT IN AERO: INHALATION_SPRAY | RESPIRATORY_TRACT | 1 refills | Status: DC

## 2021-09-01 NOTE — Telephone Encounter (Signed)
OV scheduled for 10/12/2021 at 120. One month supply of Elwin Sleight has been sent to preferred pharmacy.  Patient is aware and voiced his understanding.  Nothing further needed at this time.

## 2021-09-27 ENCOUNTER — Emergency Department (HOSPITAL_BASED_OUTPATIENT_CLINIC_OR_DEPARTMENT_OTHER)
Admission: EM | Admit: 2021-09-27 | Discharge: 2021-09-27 | Disposition: A | Discharge status: Home / Self Care | Payer: 59 | Attending: Emergency Medicine | Admitting: Emergency Medicine

## 2021-09-27 ENCOUNTER — Other Ambulatory Visit: Payer: Self-pay

## 2021-09-27 ENCOUNTER — Emergency Department (HOSPITAL_BASED_OUTPATIENT_CLINIC_OR_DEPARTMENT_OTHER): Payer: 59

## 2021-09-27 ENCOUNTER — Encounter (HOSPITAL_BASED_OUTPATIENT_CLINIC_OR_DEPARTMENT_OTHER): Payer: Self-pay

## 2021-09-27 DIAGNOSIS — E041 Nontoxic single thyroid nodule: Secondary | ICD-10-CM | POA: Diagnosis not present

## 2021-09-27 DIAGNOSIS — Z87891 Personal history of nicotine dependence: Secondary | ICD-10-CM | POA: Insufficient documentation

## 2021-09-27 DIAGNOSIS — J029 Acute pharyngitis, unspecified: Secondary | ICD-10-CM | POA: Diagnosis present

## 2021-09-27 LAB — CBC WITH DIFFERENTIAL/PLATELET
Abs Immature Granulocytes: 0.01 10*3/uL (ref 0.00–0.07)
Basophils Absolute: 0 10*3/uL (ref 0.0–0.1)
Basophils Relative: 0 %
Eosinophils Absolute: 0.1 10*3/uL (ref 0.0–0.5)
Eosinophils Relative: 2 %
HCT: 46.5 % (ref 39.0–52.0)
Hemoglobin: 16 g/dL (ref 13.0–17.0)
Immature Granulocytes: 0 %
Lymphocytes Relative: 33 %
Lymphs Abs: 2 10*3/uL (ref 0.7–4.0)
MCH: 30.9 pg (ref 26.0–34.0)
MCHC: 34.4 g/dL (ref 30.0–36.0)
MCV: 89.8 fL (ref 80.0–100.0)
Monocytes Absolute: 0.6 10*3/uL (ref 0.1–1.0)
Monocytes Relative: 9 %
Neutro Abs: 3.5 10*3/uL (ref 1.7–7.7)
Neutrophils Relative %: 56 %
Platelets: 230 10*3/uL (ref 150–400)
RBC: 5.18 MIL/uL (ref 4.22–5.81)
RDW: 12.3 % (ref 11.5–15.5)
WBC: 6.2 10*3/uL (ref 4.0–10.5)
nRBC: 0 % (ref 0.0–0.2)

## 2021-09-27 LAB — COMPREHENSIVE METABOLIC PANEL
ALT: 20 U/L (ref 0–44)
AST: 20 U/L (ref 15–41)
Albumin: 4.7 g/dL (ref 3.5–5.0)
Alkaline Phosphatase: 51 U/L (ref 38–126)
Anion gap: 8 (ref 5–15)
BUN: 21 mg/dL — ABNORMAL HIGH (ref 6–20)
CO2: 28 mmol/L (ref 22–32)
Calcium: 9.4 mg/dL (ref 8.9–10.3)
Chloride: 105 mmol/L (ref 98–111)
Creatinine, Ser: 0.86 mg/dL (ref 0.61–1.24)
GFR, Estimated: >60 mL/min (ref >60)
Glucose, Bld: 107 mg/dL — ABNORMAL HIGH (ref 70–99)
Potassium: 4 mmol/L (ref 3.5–5.1)
Sodium: 141 mmol/L (ref 135–145)
Total Bilirubin: 0.6 mg/dL (ref 0.3–1.2)
Total Protein: 7 g/dL (ref 6.5–8.1)

## 2021-09-27 LAB — GROUP A STREP BY PCR: Group A Strep by PCR: NOT DETECTED

## 2021-09-27 LAB — T4, FREE: Free T4: 1.1 ng/dL (ref 0.61–1.12)

## 2021-09-27 LAB — TSH: TSH: 1.804 u[IU]/mL (ref 0.350–4.500)

## 2021-09-27 MED ORDER — DEXAMETHASONE 4 MG PO TABS
10.0000 mg | ORAL_TABLET | Freq: Once | ORAL | Status: AC
  Administered 2021-09-27: 10 mg via ORAL
  Filled 2021-09-27: qty 3

## 2021-09-27 MED ORDER — LORAZEPAM 1 MG PO TABS: 1.0000 mg | ORAL_TABLET | Freq: Every evening | ORAL | 0 refills | Status: DC | PRN

## 2021-09-27 NOTE — ED Triage Notes (Signed)
States he has a lump near his thyroid that is restricting his airway when he swallows. He has this problem before that resolved on its own.

## 2021-09-27 NOTE — ED Provider Notes (Signed)
[facility] EMERGENCY DEPT Provider Note   CSN: [id number] Arrival date & time: 09/27/21  0141     History Chief Complaint  Patient presents with   Oral Swelling    Shawn Marquez is a 35 y.o. male.  The history is provided by the patient.  He has a history of asthma and comes in because of a lump on his thyroid gland and difficulty breathing.  He notices that when he lays down, he will have some difficulty breathing and pain when he swallows.  He has noted a lump on his thyroid gland which is tender.  This is in the midline.  Symptoms have been present for the last 3 days.  He has no symptoms when he is sitting up or standing up.  He denies fever or chills and denies any sweats.  He denies nausea, vomiting, diarrhea.   No past medical history on file.  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 No pertinent surgical history.     Family History  Problem Relation Age of Onset   Emphysema Mother        smoked   Asthma Brother     Social History   Tobacco Use   Smoking status: Former    Packs/day: 0.25    Years: 3.00    Pack years: 0.75    Types: Cigarettes    Quit date: 11/05/2008    Years since quitting: 12.9   Smokeless tobacco: Never  Substance Use Topics   Alcohol use: Yes   Drug use: Never    Home Medications Prior to Admission medications   Medication Sig Start Date End Date Taking? Authorizing Provider  albuterol (PROAIR HFA) 108 (90 Base) MCG/ACT inhaler 2 puffs every 4 hours as needed only  if your can't catch your breath 10/17/16   Nyoka Cowden, MD  mometasone-formoterol (DULERA) 200-5 MCG/ACT AERO Take 2 puffs first thing in am and then another 2 puffs about 12 hours later. 09/01/21   Nyoka Cowden, MD    Allergies    Patient has no known allergies.  Review of Systems   Review of Systems  All other systems reviewed and are negative.  Physical  Exam Updated Vital Signs BP (!) 142/85 (BP Location: Right Arm)   Pulse 72   Temp 98.4 F (36.9 C) (Oral)   Resp 14   Ht 5\' 10"  (1.778 m)   Wt 72.6 kg   SpO2 100%   BMI 22.96 kg/m   Physical Exam Vitals and nursing note reviewed.  36 year old male, resting comfortably and in no acute distress. Vital signs are significant for borderline elevated blood pressure. Oxygen saturation is 100%, which is normal. Head is normocephalic and atraumatic. PERRLA, EOMI. Oropharynx is mildly erythematous. Neck is nontender and supple without adenopathy or JVD.  Thyroid is not enlarged.  No definite nodule appreciated. Back is nontender and there is no CVA tenderness. Lungs are clear without rales, wheezes, or rhonchi. Chest is nontender. Heart has regular rate and rhythm without murmur. Abdomen is soft, flat, nontender. Extremities have no cyanosis or edema, full range of motion is present. Skin is warm and dry without rash. Neurologic: Mental status is normal, cranial nerves are intact, moves all extremities equally.  ED Results / Procedures / Treatments   Labs (all labs ordered are listed, but only abnormal results are displayed) Labs Reviewed  COMPREHENSIVE METABOLIC PANEL -  Abnormal; Notable for the following components:      Result Value   Glucose, Bld 107 (*)    BUN 21 (*)    All other components within normal limits  GROUP A STREP BY PCR  CBC WITH DIFFERENTIAL/PLATELET  TSH  T4, FREE   Radiology No results found.  Procedures Procedures   Medications Ordered in ED Medications  dexamethasone (DECADRON) tablet 10 mg (has no administration in time range)    ED Course  I have reviewed the triage vital signs and the nursing notes.  Pertinent labs & imaging results that were available during my care of the patient were reviewed by me and considered in my medical decision making (see chart for details).    MDM Rules/Calculators/A&P                         Sore throat with  questionable thyroid nodule and palpitations.  Thyroid functions are ordered.  Will check strep screen and thyroid ultrasound.  Old records are reviewed, and he has no relevant past visits.  Strep PCR is negative, labs are unremarkable.  TSH and free T4 are pending.  Thyroid ultrasound results are pending.  Patient did notice times when he felt like he could not breathe, but monitoring did not show any hypoxia.  He is reassured that these do not seem to be important physiologic events.  He will need to follow-up with his primary care provider for further work-up, is given a prescription for small number of lorazepam tablets to take at bedtime to see if it helps with the sensation that is concerning him.  Final Clinical Impression(s) / ED Diagnoses Final diagnoses:  Thyroid nodule    Rx / DC Orders ED Discharge Orders          Ordered    US THYROID  Status:  Canceled        09/27/21 0209    LORazepam (ATIVAN) 1 MG tablet  At bedtime PRN        09/27/21 0603             Dione Booze, MD 09/27/21 [phone]

## 2021-09-27 NOTE — Discharge Instructions (Addendum)
Please check my chart for the results of your thyroid blood tests and ultrasound.  Follow-up with your primary care provider for further outpatient evaluation.  Return if you are having any new or concerning symptoms.

## 2021-09-29 ENCOUNTER — Encounter (HOSPITAL_COMMUNITY): Payer: Self-pay | Admitting: Emergency Medicine

## 2021-09-29 ENCOUNTER — Other Ambulatory Visit: Payer: Self-pay

## 2021-09-29 ENCOUNTER — Ambulatory Visit (HOSPITAL_COMMUNITY)
Admission: EM | Admit: 2021-09-29 | Discharge: 2021-09-29 | Disposition: A | Discharge status: Home / Self Care | Payer: 59 | Attending: Internal Medicine | Admitting: Internal Medicine

## 2021-09-29 DIAGNOSIS — M542 Cervicalgia: Secondary | ICD-10-CM

## 2021-09-29 DIAGNOSIS — F418 Other specified anxiety disorders: Secondary | ICD-10-CM | POA: Diagnosis not present

## 2021-09-29 MED ORDER — TRAZODONE HCL 50 MG PO TABS: 50.0000 mg | ORAL_TABLET | Freq: Every day | ORAL | 0 refills | Status: AC

## 2021-09-29 MED ORDER — HYDROXYZINE HCL 50 MG PO TABS: 50.0000 mg | ORAL_TABLET | Freq: Three times a day (TID) | ORAL | 0 refills | Status: AC | PRN

## 2021-09-29 NOTE — Discharge Instructions (Signed)
Please take medications as prescribed °If you have worsening symptoms please return to urgent care to be reevaluated. ° °

## 2021-09-29 NOTE — ED Provider Notes (Signed)
[facility]    CSN: [id number] Arrival date & time: 09/29/21  1016      History   Chief Complaint Chief Complaint  Patient presents with   Sore Throat    HPI Shawn Marquez is a 35 y.o. male comes to the urgent care with pain in the neck which started a few days ago.  Patient was seen in the emergency department a couple of days ago.  Patient denies any nausea breathing or difficulty swallowing.  No swelling in the neck.  Patient has tenderness over the anterior aspect of the trachea.  No fever or chills.  In the emergency department ultrasound of his neck was remarkable for subtle focal area of inflammation in the neck muscles.  Thyroid exam was unremarkable.  Patient complains of increasing anxiety and inability to sleep.  He denies any auditory or visual hallucinations.  No pressured speech.  Patient does not feel tired although he has not slept for a few days.  He has a sensation of shortness of breath when he lays flat.  No swelling of his legs.  No chest pain or chest pressure.  No weight changes. HPI  History reviewed. No pertinent past medical history.  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 No pertinent surgical history.     Home Medications    Prior to Admission medications   Medication Sig Start Date End Date Taking? Authorizing Provider  hydrOXYzine (ATARAX/VISTARIL) 50 MG tablet Take 1 tablet (50 mg total) by mouth every 8 (eight) hours as needed for anxiety. 09/29/21  Yes Javon Snee, Britta Mccreedy, MD  traZODone (DESYREL) 50 MG tablet Take 1 tablet (50 mg total) by mouth at bedtime. 09/29/21  Yes Dreyson Mishkin, Britta Mccreedy, MD  albuterol [facility] HFA) [phone] Base) MCG/ACT inhaler 2 puffs every 4 hours as needed only  if your can't catch your breath 10/17/16   Nyoka Cowden, MD  mometasone-formoterol [facility]) 200-5 MCG/ACT AERO Take 2 puffs first thing in am and then another 2 puffs about  12 hours later. 09/01/21   Nyoka Cowden, MD    Family History Family History  Problem Relation Age of Onset   Emphysema Mother        smoked   Asthma Brother     Social History Social History   Tobacco Use   Smoking status: Former    Packs/day: 0.25    Years: 3.00    Pack years: 0.75    Types: Cigarettes    Quit date: 11/05/2008    Years since quitting: 12.9   Smokeless tobacco: Never  Substance Use Topics   Alcohol use: Yes   Drug use: Never     Allergies   Patient has no known allergies.   Review of Systems Review of Systems  Gastrointestinal: Negative.   Musculoskeletal: Negative.   Skin: Negative.   Psychiatric/Behavioral:  Positive for self-injury. Negative for agitation, hallucinations and sleep disturbance. The patient is nervous/anxious. The patient is not hyperactive.     Physical Exam Triage Vital Signs ED Triage Vitals  Enc Vitals Group     BP 09/29/21 1214 132/75     Pulse Rate 09/29/21 1214 72     Resp 09/29/21 1214 16     Temp 09/29/21 1214 98.2 F (36.8 C)     Temp Source 09/29/21 1214 Oral     SpO2 09/29/21 1214 97 %  Weight --      Height --      Head Circumference --      Peak Flow --      Pain Score 09/29/21 1213 0     Pain Loc --      Pain Edu? --      Excl. in GC? --    No data found.  Updated Vital Signs BP 132/75 (BP Location: Right Arm)   Pulse 72   Temp 98.2 F (36.8 C) (Oral)   Resp 16   SpO2 97%   Visual Acuity Right Eye Distance:   Left Eye Distance:   Bilateral Distance:    Right Eye Near:   Left Eye Near:    Bilateral Near:     Physical Exam Vitals and nursing note reviewed.  Constitutional:      General: He is not in acute distress.    Appearance: He is well-developed. He is not ill-appearing.  HENT:     Head: Atraumatic.     Right Ear: Tympanic membrane normal.     Left Ear: Tympanic membrane normal.     Mouth/Throat:     Mouth: Mucous membranes are moist.     Pharynx: No posterior  oropharyngeal erythema.  Neck:     Thyroid: No thyromegaly.  Cardiovascular:     Rate and Rhythm: Normal rate and regular rhythm.  Pulmonary:     Effort: Pulmonary effort is normal.     Breath sounds: Normal breath sounds.  Musculoskeletal:     Cervical back: Normal range of motion and neck supple.  Lymphadenopathy:     Cervical: No cervical adenopathy.  Neurological:     Mental Status: He is alert.     UC Treatments / Results  Labs (all labs ordered are listed, but only abnormal results are displayed) Labs Reviewed - No data to display  EKG   Radiology No results found.  Procedures Procedures (including critical care time)  Medications Ordered in UC Medications - No data to display  Initial Impression / Assessment and Plan / UC Course  I have reviewed the triage vital signs and the nursing notes.  Pertinent labs & imaging results that were available during my care of the patient were reviewed by me and considered in my medical decision making (see chart for details).     1.  Neck pain: Trachea tenderness with a unremarkable exam NSAIDs as needed Return to urgent care if symptoms worsen Reassurance given  2.  Anxiety attack with insomnia: Vistaril as needed for anxiety Trazodone as needed for sleep If patient's symptoms persist he will benefit from psych evaluation. Final Clinical Impressions(s) / UC Diagnoses   Final diagnoses:  Neck pain  Anxiety with limited-symptom attacks     Discharge Instructions      Please take medications as prescribed If you have worsening symptoms please return to urgent care to be reevaluated.   ED Prescriptions     Medication Sig Dispense Auth. Provider   traZODone (DESYREL) 50 MG tablet Take 1 tablet (50 mg total) by mouth at bedtime. 10 tablet Jaelon Gatley, Britta Mccreedy, MD   hydrOXYzine (ATARAX/VISTARIL) 50 MG tablet Take 1 tablet (50 mg total) by mouth every 8 (eight) hours as needed for anxiety. 21 tablet Aerica Rincon, Britta Mccreedy, MD      PDMP not reviewed this encounter.   Merrilee Jansky, MD 09/29/21 1409

## 2021-09-29 NOTE — ED Triage Notes (Signed)
Pt presents for follow-up from visit on 11/23. States unable to sleep due to discomfort in throat/ neck.

## 2021-10-10 ENCOUNTER — Other Ambulatory Visit: Payer: Self-pay

## 2021-10-10 ENCOUNTER — Ambulatory Visit: Payer: 59 | Admitting: Primary Care

## 2021-10-10 ENCOUNTER — Encounter: Payer: Self-pay | Admitting: Primary Care

## 2021-10-10 DIAGNOSIS — J45991 Cough variant asthma: Secondary | ICD-10-CM | POA: Diagnosis not present

## 2021-10-10 MED ORDER — DULERA 200-5 MCG/ACT IN AERO: INHALATION_SPRAY | RESPIRATORY_TRACT | 5 refills | Status: DC

## 2021-10-10 MED ORDER — ALBUTEROL SULFATE HFA 108 (90 BASE) MCG/ACT IN AERS: INHALATION_SPRAY | RESPIRATORY_TRACT | 1 refills | Status: DC

## 2021-10-10 NOTE — Assessment & Plan Note (Addendum)
-   Stable interval; No recent exacerbations. Using Dulera prn by mistake. No SABA use. ACT 24. Reinforced proper inhaler technique.  - Continue Dulera 1-2 puffs every 12 hours (rinse mouth after use) - Use albuterol rescue inhaler 2 puff every 4-6 hours as needed only for breakthrough symptoms  - Consider trial of Singulair if asthma flares  - Follow-up in 1 year with Dr. Sherene Sires or Waynetta Sandy NP

## 2021-10-10 NOTE — Patient Instructions (Signed)
Recommendations: - Continue Dulera 1-2 puffs every 12 hours (rinse mouth after use) - Use albuterol rescue inhaler 2 puff every 4-67 hours as needed only for breakthrough symptoms  - Notify is asthma or allergy symptoms worsen   Follow-up: - 1 year with Dr. Sherene Sires or Waynetta Sandy NP    Asthma, Adult Asthma is a long-term (chronic) condition in which the airways get tight and narrow. The airways are the breathing passages that lead from the nose and mouth down into the lungs. A person with asthma will have times when symptoms get worse. These are called asthma attacks. They can cause coughing, whistling sounds when you breathe (wheezing), shortness of breath, and chest pain. They can make it hard to breathe. There is no cure for asthma, but medicines and lifestyle changes can help control it. There are many things that can bring on an asthma attack or make asthma symptoms worse (triggers). Common triggers include: Mold. Dust. Cigarette smoke. Cockroaches. Things that can cause allergy symptoms (allergens). These include animal skin flakes (dander) and pollen from trees or grass. Things that pollute the air. These may include household cleaners, wood smoke, smog, or chemical odors. Cold air, weather changes, and wind. Crying or laughing hard. Stress. Certain medicines or drugs. Certain foods such as dried fruit, potato chips, and grape juice. Infections, such as a cold or the flu. Certain medical conditions or diseases. Exercise or tiring activities. Asthma may be treated with medicines and by staying away from the things that cause asthma attacks. Types of medicines may include: Controller medicines. These help prevent asthma symptoms. They are usually taken every day. Fast-acting reliever or rescue medicines. These quickly relieve asthma symptoms. They are used as needed and provide short-term relief. Allergy medicines if your attacks are brought on by allergens. Medicines to help control the  body's defense (immune) system. Follow these instructions at home: Avoiding triggers in your home Change your heating and air conditioning filter often. Limit your use of fireplaces and wood stoves. Get rid of pests (such as roaches and mice) and their droppings. Throw away plants if you see mold on them. Clean your floors. Dust regularly. Use cleaning products that do not smell. Have someone vacuum when you are not home. Use a vacuum cleaner with a HEPA filter if possible. Replace carpet with wood, tile, or vinyl flooring. Carpet can trap animal skin flakes and dust. Use allergy-proof pillows, mattress covers, and box spring covers. Wash bed sheets and blankets every week in hot water. Dry them in a dryer. Keep your bedroom free of any triggers. Avoid pets and keep windows closed when things that cause allergy symptoms are in the air. Use blankets that are made of polyester or cotton. Clean bathrooms and kitchens with bleach. If possible, have someone repaint the walls in these rooms with mold-resistant paint. Keep out of the rooms that are being cleaned and painted. Wash your hands often with soap and water. If soap and water are not available, use hand sanitizer. Do not allow anyone to smoke in your home. General instructions Take over-the-counter and prescription medicines only as told by your doctor. Talk with your doctor if you have questions about how or when to take your medicines. Make note if you need to use your medicines more often than usual. Do not use any products that contain nicotine or tobacco, such as cigarettes and e-cigarettes. If you need help quitting, ask your doctor. Stay away from secondhand smoke. Avoid doing things outdoors when allergen counts  are high and when air quality is low. Wear a ski mask when doing outdoor activities in the winter. The mask should cover your nose and mouth. Exercise indoors on cold days if you can. Warm up before you exercise. Take time  to cool down after exercise. Use a peak flow meter as told by your doctor. A peak flow meter is a tool that measures how well the lungs are working. Keep track of the peak flow meter's readings. Write them down. Follow your asthma action plan. This is a written plan for taking care of your asthma and treating your attacks. Make sure you get all the shots (vaccines) that your doctor recommends. Ask your doctor about a flu shot and a pneumonia shot. Keep all follow-up visits as told by your doctor. This is important. Contact a doctor if: You have wheezing, shortness of breath, or a cough even while taking medicine to prevent attacks. The mucus you cough up (sputum) is thicker than usual. The mucus you cough up changes from clear or white to yellow, green, gray, or bloody. You have problems from the medicine you are taking, such as: A rash. Itching. Swelling. Trouble breathing. You need reliever medicines more than 2-3 times a week. Your peak flow reading is still at 50-79% of your personal best after following the action plan for 1 hour. You have a fever. Get help right away if: You seem to be worse and are not responding to medicine during an asthma attack. You are short of breath even at rest. You get short of breath when doing very little activity. You have trouble eating, drinking, or talking. You have chest pain or tightness. You have a fast heartbeat. Your lips or fingernails start to turn blue. You are light-headed or dizzy, or you faint. Your peak flow is less than 50% of your personal best. You feel too tired to breathe normally. Summary Asthma is a long-term (chronic) condition in which the airways get tight and narrow. An asthma attack can make it hard to breathe. Asthma cannot be cured, but medicines and lifestyle changes can help control it. Make sure you understand how to avoid triggers and how and when to use your medicines. This information is not intended to replace  advice given to you by your health care provider. Make sure you discuss any questions you have with your health care provider. Document Revised: 02/14/2020 Document Reviewed: 02/24/2020 Elsevier Patient Education  2022 [employer].

## 2021-10-10 NOTE — Progress Notes (Signed)
@Patient  ID: , male    DOB: 03-24-1986, 35 y.o.   MRN: [id number]  Chief Complaint  Patient presents with   Follow-up    Follow up. Patient has no complaints.    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  Patient of Dr. 2011, last seen in office on 04/20/2019.  Insurance does not cover Symbicort.  Inconsistent with medications due to cost.  Previous [facility] pulmonary encounter: 06/02/2020 Patient presents today for annual follow-up for cough variant asthma. He is doing well. Ran out of Dulera and needed a refill. His symptoms are well controlled when using ICS/LABA inhaler. He reports one episode of nocturnal cough/shortness of breath when off of Dulera inhaler, needed to use his Albuterol rescue inhaler. Took him 30-20min to recover. Heat and humidity affect his asthma symptoms. His respiratory allergy panel was positive for multiple allergens. IgE 162. He has a dog and cat at home. Discussed possible trial of Singulair, he will let our office know if allergy symptoms exacerbate.   10/10/2021- interim hx  Patient presents today for overdue annual follow-up. He is doing well, no acute complaints today.  He is using Dulera on average once daily. He had some confusion over when to use his maintenance inhaler and albuterol rescue inhaler. He states that his insurance did not cover albuterol. He would at times use Dulera  every 4 hours.  He has no allergy symptoms. He does not take any over the counter antihistamines. He has cat and dog at home. Respiratory allergy panel positive for multiple allergens including dust mites, cat, dog, pecan tree. IGE 162.    Significant testing:  - FENO 10/17/2016  =   135  - Allergy profile 10/17/2016 >  Eos 0.4 /  IgE  162 pos dust cat > dog, trees  - Spirometry 10/17/2016  FEV1 3.61 (78%)  Ratio 68   No Known Allergies  Immunization History  Administered Date(s) Administered   Influenza Split 08/12/2019   Moderna Sars-Covid-2 Vaccination 06/15/2020, 07/13/2020   Tdap 09/21/2019    No past medical history on file.  Tobacco History: Social History   Tobacco Use  Smoking Status Former   Packs/day: 0.25   Years: 3.00   Pack years: 0.75   Types: Cigarettes   Quit date: 11/05/2008   Years since quitting: 12.9  Smokeless Tobacco Never   Counseling given: Not Answered   Outpatient Medications Prior to Visit  Medication Sig Dispense Refill   hydrOXYzine (ATARAX/VISTARIL) 50 MG tablet Take 1 tablet (50 mg total) by mouth every 8 (eight) hours as needed for anxiety. 21 tablet 0   traZODone (DESYREL) 50 MG tablet Take 1 tablet (50 mg total) by mouth at bedtime. 10 tablet 0   albuterol (PROAIR HFA) 108 (90 Base) MCG/ACT inhaler 2 puffs every 4 hours as needed only  if your can't catch your breath 1 Inhaler 1   mometasone-formoterol (DULERA)  200-5 MCG/ACT AERO Take 2 puffs first thing in am and then another 2 puffs about 12 hours later. 13 g 1   No facility-administered medications prior to visit.    Review of Systems  Review of Systems  Constitutional: Negative.   HENT: Negative.    Respiratory:  Negative for cough, shortness of breath and wheezing.   Cardiovascular: Negative.     Physical Exam  BP (!) 122/54 (BP Location: Left Arm, Patient Position: Sitting, Cuff Size: Normal)   Pulse 72   Temp 98.1 F (36.7 C) (Oral)   Ht 5\' 11"  (1.803 m)   Wt 163 lb  3.2 oz (74 kg)   SpO2 96%   BMI 22.76 kg/m  Physical Exam Constitutional:      Appearance: Normal appearance.  HENT:     Head: Normocephalic and atraumatic.     Mouth/Throat:     Mouth: Mucous membranes are moist.     Pharynx: Oropharynx is clear.  Cardiovascular:     Rate and Rhythm: Normal rate and regular rhythm.  Pulmonary:     Effort: Pulmonary effort is normal.     Breath sounds: Normal breath sounds. No wheezing, rhonchi or rales.  Musculoskeletal:     Cervical back: Normal range of motion and neck supple.  Skin:    General: Skin is warm and dry.  Neurological:     General: No focal deficit present.     Mental Status: He is alert and oriented to person, place, and time. Mental status is at baseline.  Psychiatric:        Mood and Affect: Mood normal.        Behavior: Behavior normal.        Thought Content: Thought content normal.        Judgment: Judgment normal.     Lab Results:  CBC    Component Value Date/Time   WBC 6.2 09/27/2021 0218   RBC 5.18 09/27/2021 0218   HGB 16.0 09/27/2021 0218   HCT 46.5 09/27/2021 0218   PLT 230 09/27/2021 0218   MCV 89.8 09/27/2021 0218   MCH 30.9 09/27/2021 0218   MCHC 34.4 09/27/2021 0218   RDW 12.3 09/27/2021 0218   LYMPHSABS 2.0 09/27/2021 0218   MONOABS 0.6 09/27/2021 0218   EOSABS 0.1 09/27/2021 0218   BASOSABS 0.0 09/27/2021 0218    BMET    Component Value Date/Time   NA 141 09/27/2021 0218   K 4.0 09/27/2021 0218   CL 105 09/27/2021 0218   CO2 28 09/27/2021 0218   GLUCOSE 107 (H) 09/27/2021 0218   BUN 21 (H) 09/27/2021 0218   CREATININE 0.86 09/27/2021 0218   CALCIUM 9.4 09/27/2021 0218   GFRNONAA >60 09/27/2021 0218    BNP No results found for: BNP  ProBNP No results found for: PROBNP  Imaging: 09/29/2021 THYROID  Result Date: 09/27/2021 CLINICAL DATA:  Palpable abnormality. Palpable abnormality of the neck overlying the region of the thyroid gland. EXAM: THYROID ULTRASOUND TECHNIQUE: Ultrasound  examination of the thyroid gland and adjacent soft tissues was performed. COMPARISON:  None. FINDINGS: Parenchymal Echotexture: Normal Isthmus: 0.4 cm Right lobe: 5.0 x 1.7 x 1.1 cm Left lobe: 4.2 x 1.1 x 1.7 cm _________________________________________________________ Estimated total number of nodules >/= 1 cm: 0 Number of spongiform nodules >/=  2 cm not described below (TR1): 0 Number of mixed cystic and solid nodules >/= 1.5 cm not described below (TR2): 0 _________________________________________________________ No discrete nodules are seen within the thyroid gland. No  abnormal lymph nodes identified. Potential slightly increased echogenicity in one of the strap muscles of the midline neck just to the right of midline. This is nonspecific and may represent an area of inflammation. This does not have the appearance of an abnormal mass. IMPRESSION: 1. Normal thyroid ultrasound. 2. Subtle area of increased echogenicity in one of the neck strap muscles just to the right of midline. This is nonspecific and could potentially be secondary to focal inflammation. The above is in keeping with the ACR TI-RADS recommendations - J Am Coll Radiol 2017;14:587-595. Electronically Signed   By: Irish Lack M.D.   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 Using Dulera prn by mistake. No SABA use. ACT 24. Reinforced proper inhaler technique.  - Continue Dulera 1-2 puffs every 12 hours (rinse mouth after use) - Use albuterol rescue inhaler 2 puff every 4-6 hours as needed only for breakthrough symptoms  - Consider trial of Singulair if asthma flares  - Follow-up in 1 year with Dr. Sherene Sires or Waynetta Sandy NP    Glenford Bayley, NP 10/10/2021

## 2021-10-11 ENCOUNTER — Other Ambulatory Visit: Payer: Self-pay | Admitting: *Deleted

## 2021-10-11 DIAGNOSIS — J45991 Cough variant asthma: Secondary | ICD-10-CM

## 2021-10-11 MED ORDER — ALBUTEROL SULFATE HFA 108 (90 BASE) MCG/ACT IN AERS: 2.0000 | INHALATION_SPRAY | Freq: Four times a day (QID) | RESPIRATORY_TRACT | 1 refills | Status: DC | PRN

## 2021-10-11 MED ORDER — ALBUTEROL SULFATE HFA 108 (90 BASE) MCG/ACT IN AERS: INHALATION_SPRAY | RESPIRATORY_TRACT | 1 refills | Status: DC

## 2021-10-11 MED ORDER — ALBUTEROL SULFATE HFA 108 (90 BASE) MCG/ACT IN AERS: 2.0000 | INHALATION_SPRAY | RESPIRATORY_TRACT | 1 refills | Status: AC | PRN

## 2021-10-11 MED ORDER — ALBUTEROL SULFATE HFA 108 (90 BASE) MCG/ACT IN AERS: 2.0000 | INHALATION_SPRAY | RESPIRATORY_TRACT | 6 refills | Status: DC | PRN

## 2021-10-12 ENCOUNTER — Ambulatory Visit: Payer: 59 | Admitting: Internal Medicine

## 2022-09-15 ENCOUNTER — Other Ambulatory Visit: Payer: Self-pay | Admitting: Primary Care

## 2023-04-05 ENCOUNTER — Other Ambulatory Visit: Payer: Self-pay | Admitting: Primary Care

## 2023-05-04 ENCOUNTER — Other Ambulatory Visit: Payer: Self-pay | Admitting: Primary Care

## 2023-06-10 ENCOUNTER — Other Ambulatory Visit: Payer: Self-pay | Admitting: Primary Care

## 2023-07-02 IMAGING — US US THYROID
1 series · 13 of 25 positions shown · non-contrast
Comparison: None.

CLINICAL DATA: Palpable abnormality. Palpable abnormality of the
neck overlying the region of the thyroid gland.

EXAM:
THYROID ULTRASOUND
TECHNIQUE: Ultrasound examination of the thyroid gland and adjacent soft
tissues was performed.

[Series 1: us thyroid · 13 of 44 slices shown]
[im 1/44]
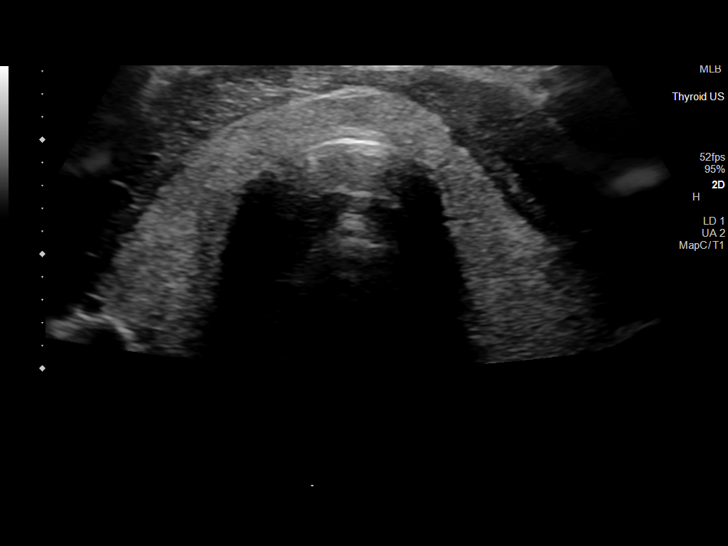
[im 4/44]
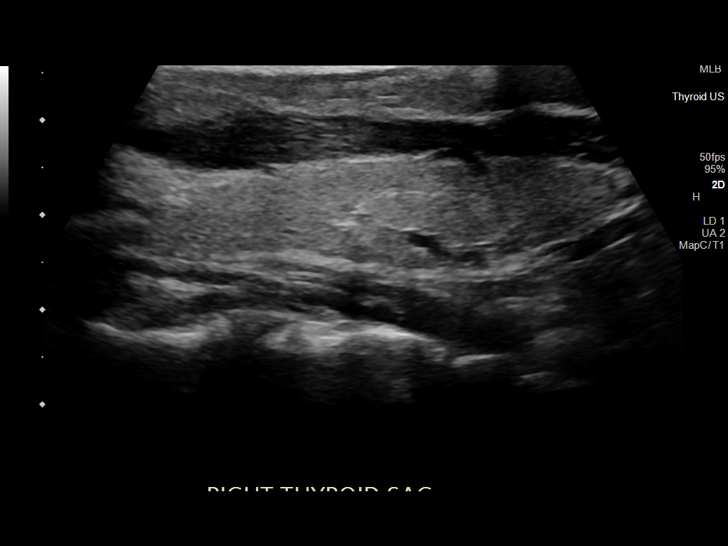
[im 8/44]
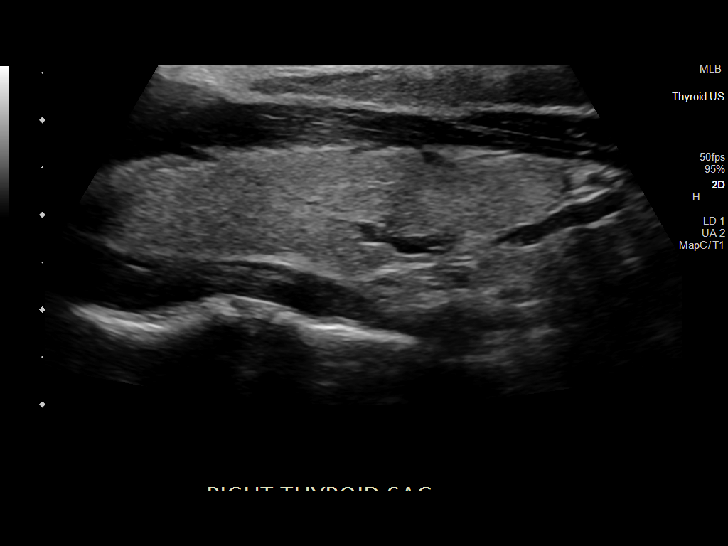
[im 11/44]
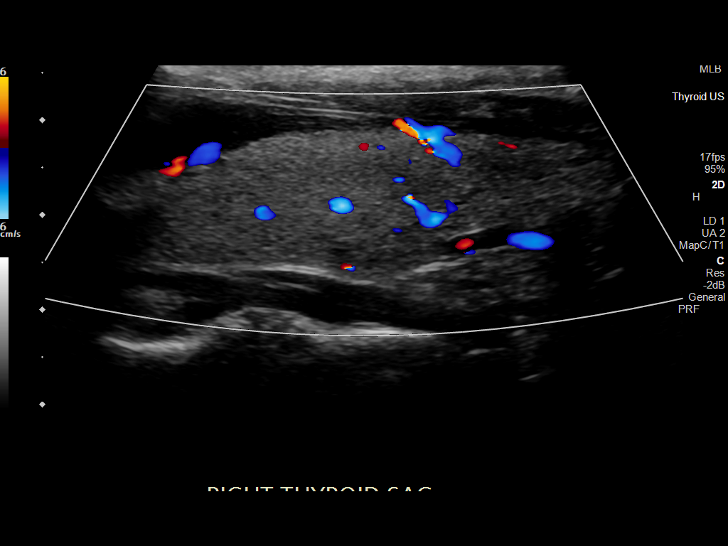
[im 15/44]
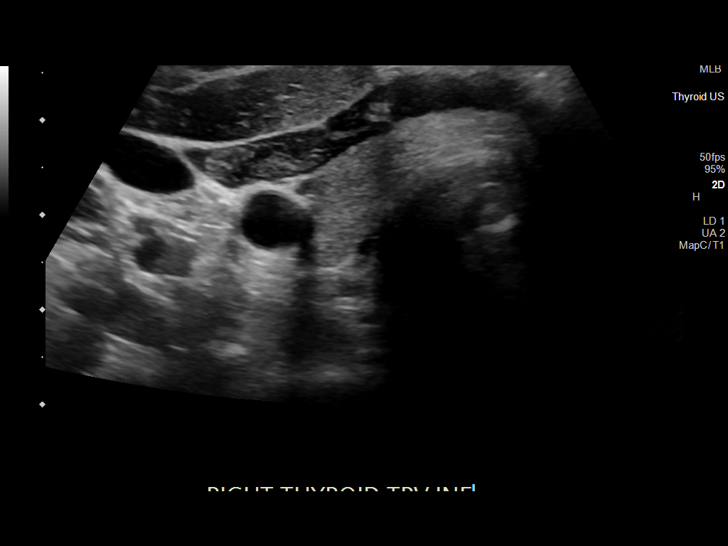
[im 18/44]
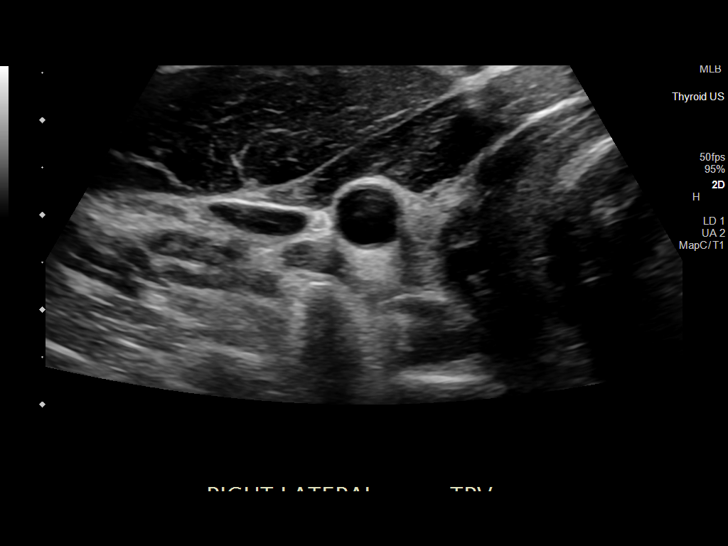
[im 22/44]
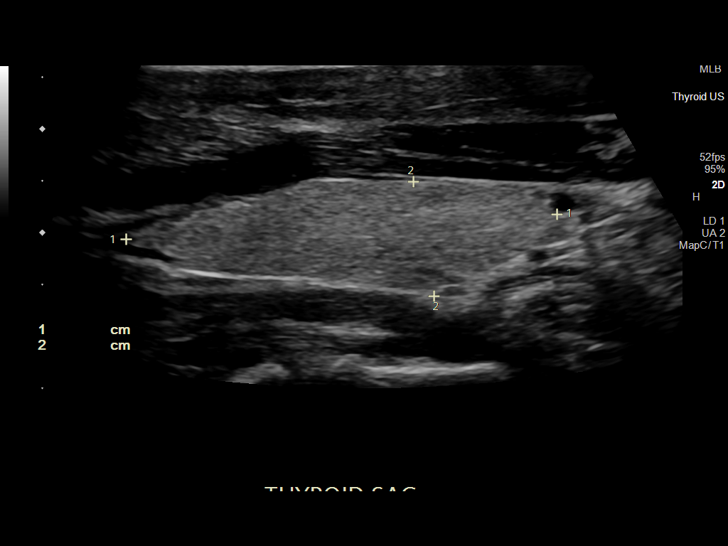
[im 26/44]
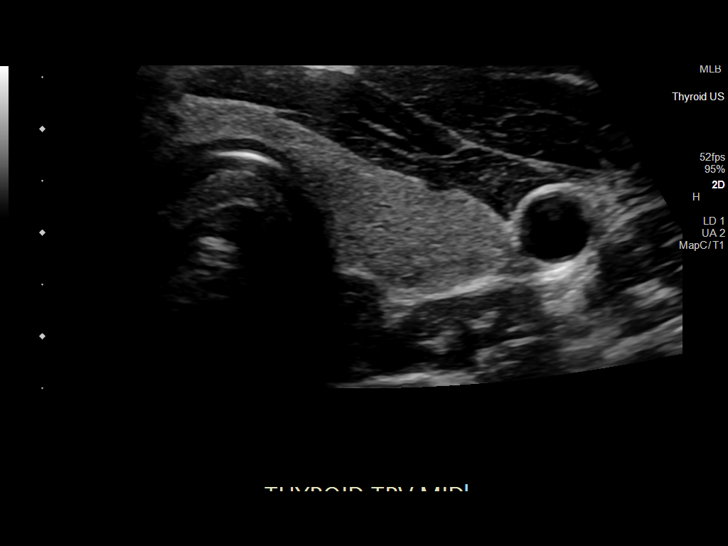
[im 29/44]
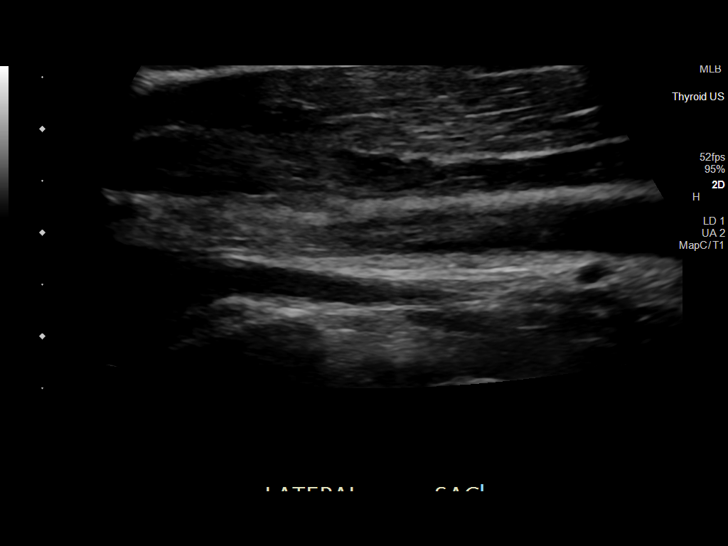
[im 33/44]
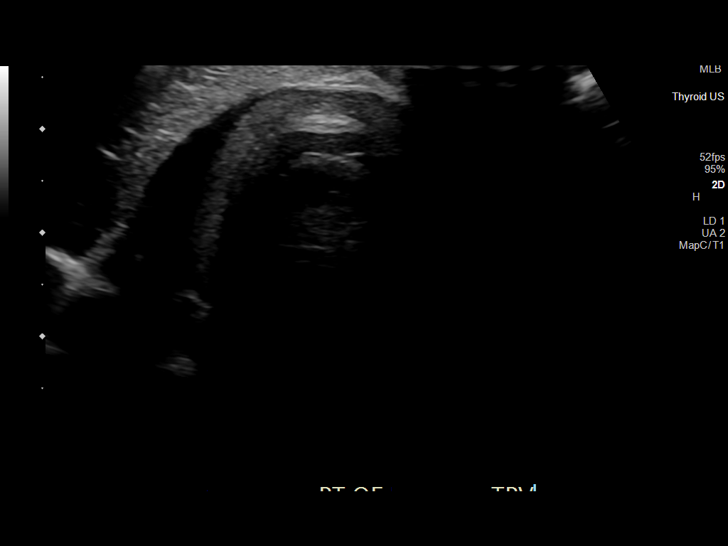
[im 36/44]
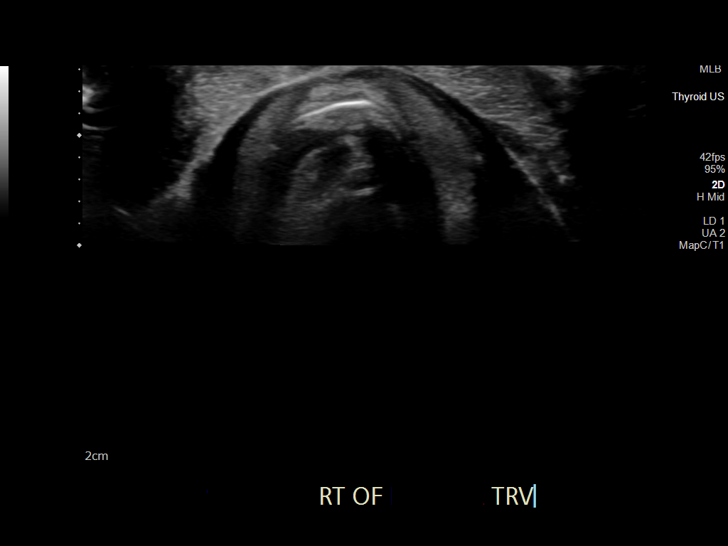
[im 40/44]
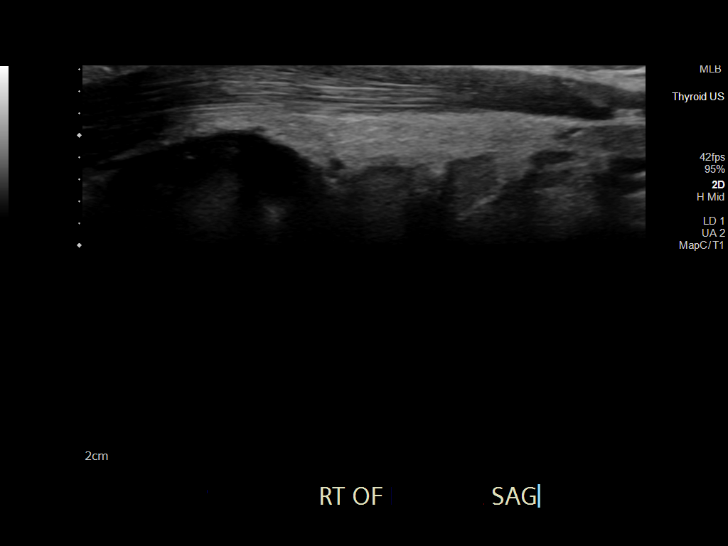
[im 44/44]
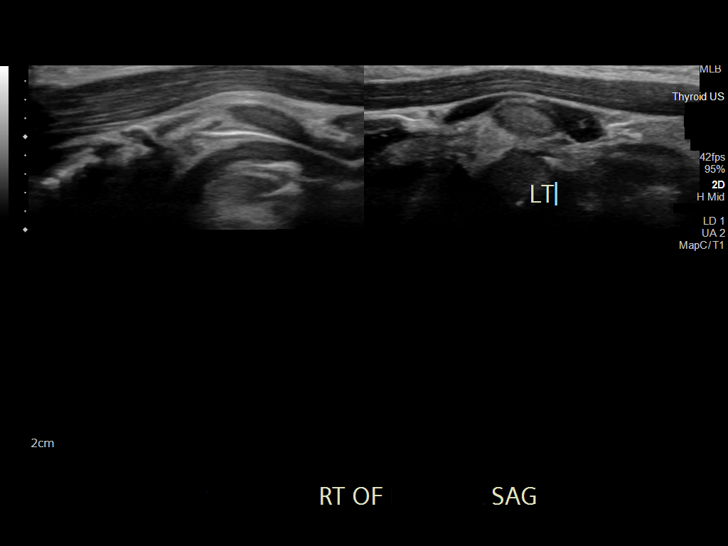

[13 of 25 positions shown; findings below may reference images not displayed]

FINDINGS: Parenchymal Echotexture: Normal

Isthmus: 0.4 cm

Right lobe: 5.0 x 1.7 x 1.1 cm

Left lobe: 4.2 x 1.1 x 1.7 cm

_________________________________________________________

Estimated total number of nodules >/= 1 cm: 0

Number of spongiform nodules >/=  2 cm not described below (TR1): 0

Number of mixed cystic and solid nodules >/= 1.5 cm not described
below (TR2): 0

_________________________________________________________

No discrete nodules are seen within the thyroid gland. No abnormal
lymph nodes identified. Potential slightly increased echogenicity in
one of the strap muscles of the midline neck just to the right of
midline. This is nonspecific and may represent an area of
inflammation. This does not have the appearance of an abnormal mass.
IMPRESSION: 1. Normal thyroid ultrasound.
2. Subtle area of increased echogenicity in one of the neck strap
muscles just to the right of midline. This is nonspecific and could
potentially be secondary to focal inflammation.

The above is in keeping with the ACR TI-RADS recommendations - [HOSPITAL] 3720;[DATE].

## 2023-11-01 ENCOUNTER — Other Ambulatory Visit: Payer: Self-pay | Admitting: Primary Care

## 2023-11-07 ENCOUNTER — Other Ambulatory Visit: Payer: Self-pay | Admitting: Primary Care

## 2023-11-07 ENCOUNTER — Telehealth: Payer: Self-pay | Admitting: Primary Care

## 2023-11-07 NOTE — Telephone Encounter (Signed)
 Patient states needs refill for Shawn Marquez. Would need refills prior to appointment. Scheduled 12/24/2023 with Ames Dura NP. Pharmacy is [facility]. Patient phone number is [phone].

## 2023-11-08 MED ORDER — DULERA 200-5 MCG/ACT IN AERO: INHALATION_SPRAY | RESPIRATORY_TRACT | 11 refills | Status: DC

## 2023-11-08 NOTE — Telephone Encounter (Signed)
 Called and spoke with pt, inhaler has been sent into the pharmacy

## 2023-11-12 ENCOUNTER — Other Ambulatory Visit: Payer: Self-pay | Admitting: Nurse Practitioner

## 2023-11-12 DIAGNOSIS — R109 Unspecified abdominal pain: Secondary | ICD-10-CM

## 2023-11-13 ENCOUNTER — Other Ambulatory Visit: Payer: Self-pay | Admitting: Nurse Practitioner

## 2023-11-13 DIAGNOSIS — R109 Unspecified abdominal pain: Secondary | ICD-10-CM

## 2023-11-19 ENCOUNTER — Other Ambulatory Visit: Payer: 59

## 2023-11-19 ENCOUNTER — Ambulatory Visit
Admission: RE | Admit: 2023-11-19 | Discharge: 2023-11-19 | Disposition: A | Discharge status: Home / Self Care | Payer: 59 | Source: Ambulatory Visit | Attending: Nurse Practitioner | Admitting: Nurse Practitioner

## 2023-11-19 DIAGNOSIS — R109 Unspecified abdominal pain: Secondary | ICD-10-CM

## 2023-12-24 ENCOUNTER — Ambulatory Visit: Payer: 59 | Admitting: Primary Care

## 2023-12-24 ENCOUNTER — Encounter: Payer: Self-pay | Admitting: Primary Care

## 2023-12-24 VITALS — BP 134/74 | HR 70 | Temp 97.8°F | Ht 71.0 in | Wt 156.2 lb

## 2023-12-24 DIAGNOSIS — J45991 Cough variant asthma: Secondary | ICD-10-CM | POA: Diagnosis not present

## 2023-12-24 MED ORDER — DULERA 200-5 MCG/ACT IN AERO: INHALATION_SPRAY | RESPIRATORY_TRACT | 11 refills | Status: AC

## 2023-12-24 NOTE — Patient Instructions (Addendum)
-COUGH VARIANT ASTHMA: Cough variant asthma is a type of asthma where the main symptom is a dry, non-productive cough. Your asthma is currently well-controlled with Dulera 200 mcg, 2 puffs as needed, primarily in the mornings. You should continue this regimen and rinse your mouth after each use to prevent potential thrush. If your symptoms worsen, increase Dulera to twice a day and use albuterol as needed. If symptoms persist, contact our office for potential prednisone treatment. Your [facility] prescription has been refilled and sent to [facility].   INSTRUCTIONS:  If your asthma symptoms worsen or persist despite increased use of Dulera and albuterol, please contact our office for further evaluation and potential prednisone treatment. Your [facility] prescription has been refilled and sent to [facility].   Follow-up:  1 year with Dr. Sherene Sires or Waynetta Sandy NP   Asthma, Adult  Asthma is a condition that causes swelling and narrowing of the airways. These are the passages that lead from the nose and mouth down into the lungs. When asthma symptoms get worse it is called an asthma attack or flare. This can make it hard to breathe. Asthma flares can range from minor to life-threatening. There is no cure for asthma, but medicines and lifestyle changes can help to control it. What are the causes? It is not known exactly what causes asthma, but certain things can cause asthma symptoms to get worse (triggers). What can trigger an asthma attack? Cigarette smoke. Mold. Dust. Your pet's skin flakes (dander). Cockroaches. Pollen. Air pollution (like household cleaners, wood smoke, smog, or Therapist, occupational). What are the signs or symptoms? Trouble breathing (shortness of breath). Coughing. Making high-pitched whistling sounds when you breathe, most often when you breathe out (wheezing). Chest tightness. Tiredness with little activity. Poor exercise tolerance. How is this treated? Controller medicines that help prevent  asthma symptoms. Fast-acting reliever or rescue medicines. These give short-term relief of asthma symptoms. Allergy medicines if your attacks are brought on by allergens. Medicines to help control the body's defense (immune) system. Staying away from the things that cause asthma attacks. Follow these instructions at home: Avoiding triggers in your home Do not allow anyone to smoke in your home. Limit use of fireplaces and wood stoves. Get rid of pests (such as roaches and mice) and their droppings. Keep your home clean. Clean your floors. Dust regularly. Use cleaning products that do not smell. Wash bed sheets and blankets every week in hot water. Dry them in a dryer. Have someone vacuum when you are not home. Change your heating and air conditioning filters often. Use blankets that are made of polyester or cotton. General instructions Take over-the-counter and prescription medicines only as told by your doctor. Do not smoke or use any products that contain nicotine or tobacco. If you need help quitting, ask your doctor. Stay away from secondhand smoke. Avoid doing things outdoors when allergen counts are high and when air quality is low. Warm up before you exercise. Take time to cool down after exercise. Use a peak flow meter as told by your doctor. A peak flow meter is a tool that measures how well your lungs are working. Keep track of the peak flow meter's readings. Write them down. Follow your asthma action plan. This is a written plan for taking care of your asthma and treating your attacks. Make sure you get all the shots (vaccines) that your doctor recommends. Ask your doctor about a flu shot and a pneumonia shot. Keep all follow-up visits. Contact a doctor if:  You have wheezing, shortness of breath, or a cough even while taking medicine to prevent attacks. The mucus you cough up (sputum) is thicker than usual. The mucus you cough up changes from clear or white to yellow, green,  gray, or is bloody. You have problems from the medicine you are taking, such as: A rash. Itching. Swelling. Trouble breathing. You need reliever medicines more than 2-3 times a week. Your peak flow reading is still at 50-79% of your personal best after following the action plan for 1 hour. You have a fever. Get help right away if: You seem to be worse and are not responding to medicine during an asthma attack. You are short of breath even at rest. You get short of breath when doing very little activity. You have trouble eating, drinking, or talking. You have chest pain or tightness. You have a fast heartbeat. Your lips or fingernails start to turn blue. You are light-headed or dizzy, or you faint. Your peak flow is less than 50% of your personal best. You feel too tired to breathe normally. These symptoms may be an emergency. Get help right away. Call 911. Do not wait to see if the symptoms will go away. Do not drive yourself to the hospital. Summary Asthma is a long-term (chronic) condition in which the airways get tight and narrow. An asthma attack can make it hard to breathe. Asthma cannot be cured, but medicines and lifestyle changes can help control it. Make sure you understand how to avoid triggers and how and when to use your medicines. Avoid things that can cause allergy symptoms (allergens). These include animal skin flakes (dander) and pollen from trees or grass. Avoid things that pollute the air. These may include household cleaners, wood smoke, smog, or chemical odors. This information is not intended to replace advice given to you by your health care provider. Make sure you discuss any questions you have with your health care provider. Document Revised: 07/31/2021 Document Reviewed: 07/31/2021 Elsevier Patient Education  2024 [employer].

## 2023-12-24 NOTE — Progress Notes (Signed)
@Patient  ID: Shawn Marquez, male    DOB: 1986/03/15, 38 y.o.   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  Patient of Dr. Sherene Sires, last seen in office on 04/20/2019.  Insurance does not cover Symbicort.  Inconsistent with medications due to cost.  Previous [facility] pulmonary encounter: 06/02/2020 Patient presents today for annual follow-up for cough variant asthma. He is doing well. Ran out of Dulera and needed a refill. His symptoms are well controlled when using ICS/LABA inhaler. He reports one episode of nocturnal cough/shortness of breath when off of Dulera inhaler, needed to use his Albuterol rescue inhaler. Took him 30-50min to recover. Heat and humidity affect his asthma symptoms. His respiratory allergy panel was positive for multiple allergens. IgE 162. He has a dog and cat at home. Discussed possible trial of Singulair, he will let our office know if allergy symptoms exacerbate.    12/24/2023- Interim hx  Discussed the use of AI scribe software for clinical note transcription with the patient, who gave verbal consent to proceed.  History of Present Illness   Shawn Marquez is a 38 year old male with cough variant asthma who presents for follow-up of asthma management.  He has not experienced any asthma flare-ups requiring prednisone or hospitalizations for  respiratory issues. His asthma symptoms are well-controlled and have not kept him from performing daily activities at work or home. His symptoms primarily include tightness in the throat area.  He uses Dulera 200 mcg, taking one to two puffs as needed, primarily in the morning due to cold weather and exposure to a cat, which he is allergic to. He uses two puffs mostly in the morning and sometimes in the afternoon. He has used a rescue inhaler once but finds that [facility] and previously Symbicort work better for his symptoms.  No asthma-related limitations in daily activities. No hospitalizations for respiratory issues, postnasal drip, itchy eyes, or other allergy symptoms. He experiences tightness in the throat area but reports no significant impact on daily activities.  He manages his cat allergy by avoiding touching his eyes after contact with the cat. He does not use an antihistamine.      Significant testing:  - FENO 10/17/2016  =   135  - Allergy profile 10/17/2016 >  Eos 0.4 /  IgE  162 pos dust cat > dog, trees  - Spirometry 10/17/2016  FEV1 3.61 (78%)  Ratio 68   No Known Allergies  Immunization History  Administered Date(s) Administered   Influenza Split 08/12/2019   Moderna Sars-Covid-2 Vaccination 06/15/2020, 07/13/2020   Tdap 09/21/2019    No past medical history on file.  Tobacco History: Social History   Tobacco Use  Smoking Status Former   Current packs/day: 0.00   Average packs/day: 0.3 packs/day for 3.0 years (0.8 ttl pk-yrs)   Types: Cigarettes  Start date: 11/05/2005   Quit date: 11/05/2008   Years since quitting: 15.1  Smokeless Tobacco Never   Counseling given: Not Answered   Outpatient Medications Prior to Visit  Medication Sig Dispense Refill   albuterol (VENTOLIN HFA) 108 (90 Base) MCG/ACT inhaler Inhale 2 puffs into the lungs every 4 (four) hours as needed for wheezing or shortness of breath. 1 each 1   hydrOXYzine (ATARAX/VISTARIL) 50 MG tablet Take 1  tablet (50 mg total) by mouth every 8 (eight) hours as needed for anxiety. 21 tablet 0   traZODone (DESYREL) 50 MG tablet Take 1 tablet (50 mg total) by mouth at bedtime. 10 tablet 0   mometasone-formoterol (DULERA) 200-5 MCG/ACT AERO INHALE 2 PUFFS BY MOUTH INTO THE LUNGS FIRST THING IN THE MORNING AND TAKE 2 PUFFS ABOUT 12 HOURS LATER 13 g 11   No facility-administered medications prior to visit.   Review of Systems  Review of Systems  Constitutional: Negative.   HENT: Negative.    Respiratory: Negative.     Physical Exam  BP 134/74 (BP Location: Right Arm, Patient Position: Sitting, Cuff Size: Normal)   Pulse 70   Temp 97.8 F (36.6 C) (Oral)   Ht 5\' 11"  (1.803 m)   Wt 156 lb 3.2 oz (70.9 kg)   SpO2 99%   BMI 21.79 kg/m  Physical Exam Constitutional:      General: He is not in acute distress.    Appearance: Normal appearance. He is not ill-appearing.  HENT:     Head: Normocephalic and atraumatic.     Mouth/Throat:     Mouth: Mucous membranes are moist.     Pharynx: Oropharynx is clear.  Cardiovascular:     Rate and Rhythm: Normal rate and regular rhythm.  Pulmonary:     Effort: Pulmonary effort is normal.     Breath sounds: Normal breath sounds. No wheezing, rhonchi or rales.  Skin:    General: Skin is warm and dry.  Neurological:     General: No focal deficit present.     Mental Status: He is alert and oriented to person, place, and time. Mental status is at baseline.      Lab Results:  CBC    Component Value Date/Time   WBC 6.2 09/27/2021 0218   RBC 5.18 09/27/2021 0218   HGB 16.0 09/27/2021 0218   HCT 46.5 09/27/2021 0218   PLT 230 09/27/2021 0218   MCV 89.8 09/27/2021 0218   MCH 30.9 09/27/2021 0218   MCHC 34.4 09/27/2021 0218   RDW 12.3 09/27/2021 0218   LYMPHSABS 2.0 09/27/2021 0218   MONOABS 0.6 09/27/2021 0218   EOSABS 0.1 09/27/2021 0218   BASOSABS 0.0 09/27/2021 0218    BMET    Component Value Date/Time   NA 141 09/27/2021 0218   K  4.0 09/27/2021 0218   CL 105 09/27/2021 0218   CO2 28 09/27/2021 0218   GLUCOSE 107 (H) 09/27/2021 0218   BUN 21 (H) 09/27/2021 0218   CREATININE 0.86 09/27/2021 0218   CALCIUM 9.4 09/27/2021 0218   GFRNONAA >60 09/27/2021 0218    BNP No results found for: "BNP"  ProBNP No results found for: "PROBNP"  Imaging: No results found.   Assessment & Plan:   1. Cough variant asthma vs UACS/vcd (Primary)     Cough Variant Asthma Controlled with Dulera 200 mcg, 2 puffs as needed, primarily in the mornings. No recent flare-ups requiring prednisone or hospitalizations. Noted potential triggers include cold weather, heat vents,  and cat allergy. -Continue Dulera 200 mcg, 2 puffs q12 hours as needed. -If symptoms persist despite increased Dulera and albuterol use, contact office for potential prednisone treatment. -Advised to maintain good oral hygiene, including rinsing mouth after Dulera use. -Consider use of a spacer with inhaler to direct medication flow and reduce potential oral health impacts.       Glenford Bayley, NP 12/24/2023

## 2024-01-20 ENCOUNTER — Telehealth (INDEPENDENT_AMBULATORY_CARE_PROVIDER_SITE_OTHER): Payer: Self-pay | Admitting: Otolaryngology

## 2024-01-20 NOTE — Telephone Encounter (Signed)
 Reminder Call:  Name: Shawn Marquez, Shawn Marquez MRN: [id number]  Date: 01/21/2024 Left voicemail w/time and location-[address] [place], [place] [address]

## 2024-01-21 ENCOUNTER — Encounter (INDEPENDENT_AMBULATORY_CARE_PROVIDER_SITE_OTHER): Payer: Self-pay

## 2024-01-21 ENCOUNTER — Ambulatory Visit (INDEPENDENT_AMBULATORY_CARE_PROVIDER_SITE_OTHER): Payer: 59 | Admitting: Otolaryngology

## 2024-01-21 VITALS — BP 120/76 | HR 75 | Ht 71.0 in | Wt 155.0 lb

## 2024-01-21 DIAGNOSIS — K143 Hypertrophy of tongue papillae: Secondary | ICD-10-CM | POA: Diagnosis not present

## 2024-01-21 DIAGNOSIS — J392 Other diseases of pharynx: Secondary | ICD-10-CM

## 2024-01-21 NOTE — Progress Notes (Signed)
 Dear Dr. Wynelle Link, Here is my assessment for our mutual patient, Shawn Marquez. Thank you for allowing me the opportunity to care for your patient. Please do not hesitate to contact me should you have any other questions. Sincerely, Dr. Jovita Kussmaul  Otolaryngology Clinic Note Referring provider: Dr. Wynelle Link HPI:  Shawn Marquez is a 38 y.o. male kindly referred by Dr. Wynelle Link for evaluation of globus sensation and base of tongue lesion.  Initial visit (01/2024): Noted in early January after he ate something (unclear what) and felt it graze something in the back of his tongue. Did not choke on the food but just noted it. Able to feel with his finger, not changed in size. Not painful. No other symptoms related to it besides just being able to feel it. No lump in throat No reflux, no GERD. Prior saw GI and underwent an EGD for pill dysphagia, but resolved. Do not have records for this but done about 3 years ago.  He also had a barium esophagram Wife had a tongue mass which was biopsied recently.  Patient otherwise denies: - dysphagia, odynophagia, aspiration episodes or PNA, need for Heimlich, unintentional weight loss - changes in voice, shortness of breath, hemoptysis, tobacco or significant alcohol history - ear pain, neck masses  H&N Surgery: no Personal or FHx of bleeding dz or anesthesia difficulty: no  PMHx: Healthy  Tobacco: no. Alcohol: no. Occupation: meat cutter  Independent Review of Additional Tests or Records:  Dr. Wynelle Link IM (12/02/2023) referral notes reviewed and uploaded or available in chart: swelling along back of the tongue, cannot see the area but can feel; concern for carcinoma per patient; prior GI eval also done; offered EGD, but wants to see ENT first. Dx: Tongue mass(?), globus; Ref ENT TSH 09/27/2021: wnl; CBC and CMP 09/27/2021: wnl, BUN/Cr 21/0.86 Esophagram 07/28/2018 independently reviewed and interpreted: normal swallow but suboptimal eval of OP swallow; no obvious  aspiration; normal mobility without significant reflux.   PMH/Meds/All/SocHx/FamHx/ROS:  History reviewed. No pertinent past medical history.   History reviewed. No pertinent surgical history.  Family History  Problem Relation Age of Onset   Emphysema Mother        smoked   Asthma Brother      Social Connections: Not on file      Current Outpatient Medications:    mometasone-formoterol (DULERA) 200-5 MCG/ACT AERO, INHALE 2 PUFFS BY MOUTH INTO THE LUNGS FIRST THING IN THE MORNING AND TAKE 2 PUFFS ABOUT 12 HOURS LATER, Disp: 13 g, Rfl: 11   albuterol (VENTOLIN HFA) 108 (90 Base) MCG/ACT inhaler, Inhale 2 puffs into the lungs every 4 (four) hours as needed for wheezing or shortness of breath., Disp: 1 each, Rfl: 1   hydrOXYzine (ATARAX/VISTARIL) 50 MG tablet, Take 1 tablet (50 mg total) by mouth every 8 (eight) hours as needed for anxiety. (Patient not taking: Reported on 01/21/2024), Disp: 21 tablet, Rfl: 0   traZODone (DESYREL) 50 MG tablet, Take 1 tablet (50 mg total) by mouth at bedtime. (Patient not taking: Reported on 01/21/2024), Disp: 10 tablet, Rfl: 0   Physical Exam:   BP 120/76 (BP Location: Left Arm, Patient Position: Sitting, Cuff Size: Normal)   Pulse 75   Ht 5\' 11"  (1.803 m)   Wt 155 lb (70.3 kg)   SpO2 96%   BMI 21.62 kg/m   Salient findings:  CN II-XII intact  Bilateral EAC clear and TM intact with well pneumatized middle ear spaces Anterior rhinoscopy: Septum relatively midline; bilateral inferior turbinates without  significant hypertrophy No lesions of oral cavity/oropharynx; dentition good; the area he describes/points to appears to be just right of midline, consistent with circumvallate papillae of tongue; no palpable tongue base mass I am able to appreciate. TFL was indicated to better evaluate the proximal airway, given the patient's history and exam findings, and is detailed below. No obviously palpable neck masses/lymphadenopathy/thyromegaly No respiratory  distress or stridor  Seprately Identifiable Procedures:  Procedure Note Pre-procedure diagnosis:  Tongue base lesion Post-procedure diagnosis: Same Procedure: Transnasal Fiberoptic Laryngoscopy, CPT 31575 - Mod 25 Indication: see above Complications: None apparent EBL: 0 mL  The procedure was undertaken to further evaluate the patient's complaint of tongue base lesion, with mirror exam inadequate for appropriate examination due to gag reflex and poor patient tolerance  Procedure:  Patient was identified as correct patient. Verbal consent was obtained. The nose was sprayed with oxymetazoline and 4% lidocaine. The The flexible laryngoscope was passed through the nose to view the nasal cavity, pharynx (oropharynx, hypopharynx) and larynx.  The larynx was examined at rest and during multiple phonatory tasks. Documentation was obtained and reviewed with patient. The scope was removed. The patient tolerated the procedure well.  Findings: The nasal cavity and nasopharynx did not reveal any masses or lesions, mucosa appeared to be without obvious lesions. The tongue base, pharyngeal walls, piriform sinuses, vallecula, epiglottis and postcricoid region are normal in appearance without any noted masses - area he points to appears to be circumvallate papillae, lingual tonsil area. The visualized portion of the subglottis and proximal trachea is widely patent. The vocal folds are mobile bilaterally. There are no lesions on the free edge of the vocal folds nor elsewhere in the larynx worrisome for malignancy.      Electronically signed by: Read Drivers, MD 01/21/2024 9:13 AM   Impression & Plans:  Shawn Marquez is a 38 y.o. male with:  1. Tongue papillae hypertrophy    Noted two month history of posterior tongue/tongue base lesion; TFL reassuring; no palpable masses felt; appears to be slight tongue papillae prominence/circumvallate papillae. Reassured, advised to call us if something  changes  F/u PRN, return precautions discussed  See below regarding exact medications prescribed this encounter including dosages and route: No orders of the defined types were placed in this encounter.     Thank you for allowing me the opportunity to care for your patient. Please do not hesitate to contact me should you have any other questions.  Sincerely, Jovita Kussmaul, MD Otolaryngologist (ENT), [facility] Phone: [phone] Fax: [phone]  01/21/2024, 9:13 AM   I have personally spent 49 minutes involved in face-to-face and non-face-to-face activities for this patient on the day of the visit.  Professional time spent excludes any procedures performed but includes the following activities, in addition to those noted in the documentation: preparing to see the patient (review of outside documentation and results), performing a medically appropriate examination, counseling, documenting in the electronic health record, independently interpreting results (Esophagram).

## 2024-02-17 ENCOUNTER — Ambulatory Visit: Payer: Self-pay

## 2024-02-17 DIAGNOSIS — J45991 Cough variant asthma: Secondary | ICD-10-CM

## 2024-02-17 NOTE — Telephone Encounter (Signed)
 Chief Complaint: throat tightness Symptoms: dry mouth, throat Frequency: x 2 months Pertinent Negatives: Patient denies fever, URI sx, CP, severe SOB, tongue swelling Disposition: [] ED /[] Urgent Care (no appt availability in office) / [x] Appointment(In office/virtual)/ []  [facility]/ [] Home Care/ [] Refused Recommended Disposition /[] [facility]/ []  Follow-up with PCP Additional Notes: Pt c/o throat tightness, dry mouth x 2 months. Pt reports has been using respiratory INH as prescribed with minimal/no relief. Pt has not used rescue INH. Triager attempted to schedule with [facility], but no access. Advised to follow up with PCP per dispo, but call was disconnected before pt could verbalize understanding. Called pt back and left VM to call back for further guidance/disposition.   Copied from CRM [id number]. Topic: Clinical - Red Word Triage >> Feb 17, 2024  4:24 PM Chantha C wrote: Red Word that prompted transfer to Nurse Triage: Patient has been having shortness of breath since last visit with NP, Clent Ridges, symptoms flares up and stays with both inhaler with no relief. Patient currently having shortness of breath, dizziness, tighting in the throat, and dry mouth. Patient denies pain, nor fever. Patient would like to be seen. Please advise [phone]. Reason for Disposition  [1] MODERATE longstanding difficulty breathing (e.g., speaks in phrases, SOB even at rest, pulse 100-120) AND [2] SAME as normal  Answer Assessment - Initial Assessment Questions E2C2 Pulmonary Triage - Initial Assessment Questions "Chief Complaint (e.g., cough, sob, wheezing, fever, chills, sweat or additional symptoms) *Go to specific symptom protocol after initial questions. throat tightness "breathing thru a straw in my neck" Dry mouth, tongue throat Recent UC visit and CXR unremarkable  "How long have symptoms been present?" X 2 months  Have you tested for COVID or Flu? Note: If not, ask patient if a  home test can be taken. If so, instruct patient to call back for positive results. No Recent COVID neg test  MEDICINES:   "Have you used any OTC meds to help with symptoms?" Yes If yes, ask "What medications?" Flonase Zyrtec Vicks spray  "Have you used your inhalers/maintenance medication?" Yes If yes, "What medications?" mometasone-formoterol (DULERA) - 2 puffs , 1-2x a day Albuterol PRN - last used 4-5 days ago  If inhaler, ask "How many puffs and how often?" Note: Review instructions on medication in the chart. See above  OXYGEN: "Do you wear supplemental oxygen?" No If yes, "How many liters are you supposed to use?" N/a  "Do you monitor your oxygen levels?" Yes If yes, "What is your reading (oxygen level) today?" 95-98  "What is your usual oxygen saturation reading?"  (Note: Pulmonary O2 sats should be 90% or greater) Mid- to high-90s   1. RESPIRATORY STATUS: "Describe your breathing?" (e.g., wheezing, shortness of breath, unable to speak, severe coughing)      SOB with exertion 2. ONSET: "When did this breathing problem begin?"      A few months 3. PATTERN "Does the difficult breathing come and go, or has it been constant since it started?"      Comes and goes - events are sporatic - can last up to half day 4. SEVERITY: "How bad is your breathing?" (e.g., mild, moderate, severe)    - MILD: No SOB at rest, mild SOB with walking, speaks normally in sentences, can lie down, no retractions, pulse < 100.    - MODERATE: SOB at rest, SOB with minimal exertion and prefers to sit, cannot lie down flat, speaks in phrases, mild retractions, audible wheezing, pulse 100-120.    -  SEVERE: Very SOB at rest, speaks in single words, struggling to breathe, sitting hunched forward, retractions, pulse > 120      mild 5. RECURRENT SYMPTOM: "Have you had difficulty breathing before?" If Yes, ask: "When was the last time?" and "What happened that time?"      First time, not like asthma  flare up 6. CARDIAC HISTORY: "Do you have any history of heart disease?" (e.g., heart attack, angina, bypass surgery, angioplasty)      denes 7. LUNG HISTORY: "Do you have any history of lung disease?"  (e.g., pulmonary embolus, asthma, emphysema)     Asthma Immediate family hx of emphysema 8. CAUSE: "What do you think is causing the breathing problem?"      unknown 9. OTHER SYMPTOMS: "Do you have any other symptoms? (e.g., dizziness, runny nose, cough, chest pain, fever)     denies  Protocols used: Breathing Difficulty-A-AH

## 2024-02-17 NOTE — Telephone Encounter (Signed)
 3rd attempt to call patient. No answer. Left voicemail. Routing to clinic.

## 2024-02-18 NOTE — Telephone Encounter (Signed)
Beth, please advise. Thanks 

## 2024-02-18 NOTE — Telephone Encounter (Signed)
 Agent reporting calling back after receiving calls from nurse, pt not getting relief from maintenance or rescue inhalers, not SOB right now just with movement.  TRIAGE SUMMARY NOTE: Pt confirms that his SOB is unchanged from yesterday's triage with another nurse. Pt reporting that he has not gotten any relief from either dulera or rescue albuterol inhalers, pt been taking inhalers as prescribed as well as tried taking dulera and albuterol at same time, pt concerned neither inhaler is effective for his body anymore, not been successful in helping him open the airway enough to expel phlegm like usual with the inhalers. Pt confirms that he is not SOB at this time, currently sitting down, but his SOB with exertion is "different each time," ranging from mild to severe. Pt has gotten to the point of breathing being "so narrow in throat and collar bone area where really hard to cough," or dizziness in which he felt like he was about to faint and "fingers and toes were tingling." Pt also reporting that "whenever look down, kinda irritates my neck, inflames my asthma, narrow breathing like out of a straw, feels like muscles are tight," has to do this position with work very often and cannot get relief. Advised pt get to hospital right away call 911 if this were to happen again with severe SOB. Pt confirms hx of needing imaging and swallow scans done on neck/adam's apple last year. Pt confirms no SOB currently and no trouble SOB with walking leisurely, going to bathroom, or getting dressed. Advised pt go to ED for more immediate care since severe SOB comes on randomly, inhalers not effective, and potential issue with neck requiring further imaging. Advised that sending HP message to pulm office to call back pt if further or other recommendations. Advised strongly that pt go to ED if any worsening. Pt verbalized understanding, unsure if pt will go to ED at this time, pt requesting pulm appt. Please advise.  E2C2 Pulmonary  Triage - Initial Assessment Questions "Chief Complaint (e.g., cough, sob, wheezing, fever, chills, sweat or additional symptoms) *Go to specific symptom protocol after initial questions. Same as yesterday, not relief from either, mostly in mornings when first wake up in bed, didn't need it this morning, not SOB currently also sitting down, whenever exerting myself then SOB, doesn't do anything, been trying dulera at same time as albuterol inhaler as well Different each time, sometimes 5 min linger 30 min an hour Not been successful in getting phlegm out, breathing gets so narrow in throat and collar bone area where really hard to cough Tried taking allergy meds, been taking flonase, vicks spray, zyrtec, ibuprofen, UC gave taper prednisone 2 weeks ago, didn't help Got lung x-ray last week from UC nothing abnormal No weakness, not fatigue but kind of if it gets too out of control then dizziness and felt like almost going to faint where fingers and toes were tingling for a second, not always like that Not helping like it used to No trouble with walking normally, going to bathroom, getting dressed Whenever look down kinda irritates my neck, inflames the asthma, narrow breathing like out of a straw, feels like muscles are tight, for last 3-4 weeks Got ultrasound on adam's apple last year, scan with contrast, chalk substance swallow scan, nothing abnormal  "Have you used your inhalers/maintenance medication?" Yes If yes, "What medications?" Dulera, albuterol  Reason for Disposition  Patient sounds very sick or weak to the triager  Answer Assessment - Initial Assessment Questions 5. SEVERITY: "  How bad is this attack?"    - MILD: No SOB at rest, mild SOB with walking, speaks normally in sentences, can lie down, no retractions, pulse < 100. (GREEN Zone: PEFR 80-100%)   - MODERATE: SOB at rest, SOB with minimal exertion and prefers to sit, cannot lie down flat, speaks in phrases, mild retractions,  audible wheezing, pulse 100-120. (YELLOW Zone: PEFR 50-79%)    - SEVERE: Struggling for each breath, speaks in single words, struggling to breathe, sitting hunched forward, retractions, usually loud wheezing, sometimes minimal wheezing because of decreased air movement, pulse > 120. (RED Zone: PEFR < 50%).      Pt states it is different each time, ranging from mild to severe 6. ASTHMA MEDICINES:  "What treatments have you tried?"    - INHALED QUICK RELIEF (RESCUE): "What is your inhaled quick-relief medicine?" (e.g., albuterol, salbutamol) "Do you use an inhaler or a nebulizer?" "How frequently have you been using this medicine?"   - CONTROLLER (LONG-TERM-CONTROL): "Do you take an inhaled steroid? (e.g., Asmanex, Flovent, Pulmicort, Qvar)     Pt stating he has not gotten any relief from his inhalers  Protocols used: Asthma Attack-A-AH

## 2024-02-19 MED ORDER — PREDNISONE 10 MG PO TABS: ORAL_TABLET | ORAL | 0 refills | Status: DC

## 2024-02-19 NOTE — Telephone Encounter (Addendum)
 Patient needs to take Dulera 2 puffs every 12 hours.   Please send in another prednisone taper 40mg  x 3 days, 30mg  x 3 days, 20mg  x 3 days, 10mg  x 3 days. He should take mucinex 1200mg  twice daily and start OTC omeprazole 20mg  twice daily as reflux could be contributing  Needs first available, may want to consider sleep study and repeat PFTs/FENO

## 2024-02-19 NOTE — Telephone Encounter (Signed)
 Ok, thanks.

## 2024-02-19 NOTE — Addendum Note (Signed)
 Addended by: Goldie Later on: 02/19/2024 03:13 PM   Modules accepted: Orders

## 2024-02-19 NOTE — Telephone Encounter (Addendum)
 Spoke with patient regarding prior message . Patient stated he doesn't want the prednisone patient already had prednisone from the urgent care that he went to. Per Beth recommendations patient to  Dulera 2 puffs every 12 hours He should take mucinex 1200mg  twice daily and start OTC omeprazole 20mg  twice daily as reflux could be contributing . Patient has been scheduled for a f.u with Beth on 03/10/2024 at 1:30 pm.  Patient has been set up for a office visit with Beth .   Nothing else further needed  [facility]

## 2024-02-19 NOTE — Addendum Note (Signed)
 Addended by: Goldie Later on: 02/19/2024 02:30 PM   Modules accepted: Orders

## 2024-03-10 ENCOUNTER — Ambulatory Visit: Admitting: Primary Care

## 2024-06-16 ENCOUNTER — Encounter (INDEPENDENT_AMBULATORY_CARE_PROVIDER_SITE_OTHER): Payer: Self-pay | Admitting: Otolaryngology

## 2024-06-16 ENCOUNTER — Ambulatory Visit (INDEPENDENT_AMBULATORY_CARE_PROVIDER_SITE_OTHER): Admitting: Audiology

## 2024-06-16 ENCOUNTER — Ambulatory Visit (INDEPENDENT_AMBULATORY_CARE_PROVIDER_SITE_OTHER): Admitting: Otolaryngology

## 2024-06-16 VITALS — BP 118/78 | HR 71 | Ht 71.0 in | Wt 160.0 lb

## 2024-06-16 DIAGNOSIS — Z0110 Encounter for examination of ears and hearing without abnormal findings: Secondary | ICD-10-CM

## 2024-06-16 DIAGNOSIS — H93292 Other abnormal auditory perceptions, left ear: Secondary | ICD-10-CM

## 2024-06-16 DIAGNOSIS — H9312 Tinnitus, left ear: Secondary | ICD-10-CM

## 2024-06-16 DIAGNOSIS — K143 Hypertrophy of tongue papillae: Secondary | ICD-10-CM

## 2024-06-16 NOTE — Progress Notes (Signed)
  [address], [address] [place], [place] [address] [phone]  Audiological Evaluation    Name: Shawn Marquez     DOB:   July 07, 1986      MRN:   [id number]                                                                                     Service Date: 06/16/2024     Accompanied by: unaccompanied   Patient comes today after Dr. Tobie, ENT sent a referral for a hearing evaluation due to concerns with tinnitus.   Symptoms Yes Details  Hearing loss  []    Tinnitus  [x]  Left - sometimes more noticeable than others- onset June 2025  Ear pain/ infections/pressure  []    Balance problems  [x]  Reports looses balance when he looks around  Noise exposure history  []    Previous ear surgeries  []    Family history of hearing loss  []    Amplification  []    Other  []      Otoscopy: Right ear: Clear external ear canal and notable landmarks visualized on the tympanic membrane. Left ear:  Clear external ear canal and notable landmarks visualized on the tympanic membrane.  Tympanometry: Right ear: Type A- Normal external ear canal volume with normal middle ear pressure and tympanic membrane compliance. Left ear: Type A- Normal external ear canal volume with normal middle ear pressure and tympanic membrane compliance.   Pure tone Audiometry: Right ear- Normal hearing from [phone].   Left ear-  Normal hearing from [phone].     Of note - using high frequency headphones tested from 10k-20kHz and results showed normal thresholds in the right ear and normal hearing with notches at [facility] and 12.5 Hz in the left ear.  Speech Audiometry: Right ear- Speech Reception Threshold (SRT) was obtained at 0 dBHL. Left ear-Speech Reception Threshold (SRT) was obtained at 0 dBHL.   Word Recognition Score Tested using NU-6 (recorded) Right ear: 100% was obtained at a presentation level of 50 dBHL with contralateral masking which is deemed as  excellent. Left ear: 100% was obtained at a  presentation level of 50 dBHL with contralateral masking which is deemed as  excellent.   The hearing test results were completed under headphones and results are deemed to be of good reliability. Test technique:  conventional     Recommendations: Follow up with ENT as scheduled for today. Return for a hearing evaluation if concerns with hearing changes arise or per MD recommendation. Consider various tinnitus strategies, including the use of a sound generator, and/or tinnitus retraining therapy.    Zykera Abella MARIE LEROUX-MARTINEZ, AUD

## 2024-06-16 NOTE — Patient Instructions (Signed)
 Lipoflavonoids

## 2024-06-16 NOTE — Progress Notes (Signed)
 Dear Dr. Alben, Here is my assessment for our mutual patient, Shawn Marquez. Thank you for allowing me the opportunity to care for your patient. Please do not hesitate to contact me should you have any other questions. Sincerely, Dr. Eldora Blanch  Otolaryngology Clinic Note Referring provider: Dr. Alben HPI:  Shawn Marquez is a 38 y.o. male kindly referred by Dr. Alben for evaluation of globus sensation and base of tongue lesion and tinnitus.  Initial visit (01/2024): Noted in early January after he ate something (unclear what) and felt it graze something in the back of his tongue. Did not choke on the food but just noted it. Able to feel with his finger, not changed in size. Not painful. No other symptoms related to it besides just being able to feel it. No lump in throat No reflux, no GERD. Prior saw GI and underwent an EGD for pill dysphagia, but resolved. Do not have records for this but done about 3 years ago.  He also had a barium esophagram Wife had a tongue mass which was biopsied recently.  Patient otherwise denies: - dysphagia, odynophagia, aspiration episodes or PNA, need for Heimlich, unintentional weight loss - changes in voice, shortness of breath, hemoptysis, tobacco or significant alcohol history - ear pain, neck masses  --------------------------------------------------------- 06/16/2024 Randomly started to have left ear non-pulsatile tinnitus starting in June. Denies antecedent noise exposure. No medication changes, no supplements. No neck pain or back pain. No regular aspirin use. Has not gone away. Some days more noticeable. Some issues when sleeping. Frequency gets higher with noise. No issues with tongue. Patient denies: ear pain, fullness, vertigo, drainage Patient additionally denies: deep pain in ear canal, eustachian tube symptoms such as popping, crackling, sensitive to pressure changes Patient also denies barotrauma, vestibular suppressant use, ototoxic  medication use Prior ear surgery: no  H&N Surgery: no Personal or FHx of bleeding dz or anesthesia difficulty: no  PMHx: Healthy  Tobacco: no. Alcohol: no. Occupation: meat cutter  Independent Review of Additional Tests or Records:  Dr. Austin IM (12/02/2023) referral notes reviewed and uploaded or available in chart: swelling along back of the tongue, cannot see the area but can feel; concern for carcinoma per patient; prior GI eval also done; offered EGD, but wants to see ENT first. Dx: Tongue mass(?), globus; Ref ENT TSH 09/27/2021: wnl; CBC and CMP 09/27/2021: wnl, BUN/Cr 21/0.86 Esophagram 07/28/2018 independently reviewed and interpreted: normal swallow but suboptimal eval of OP swallow; no obvious aspiration; normal mobility without significant reflux.  Shawn Marquez (PA-C) 06/15/2024: serous otits media and tinnitus - offered flonase, declined; ref to ENT for tinnitus.  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 No pertinent past medical history.   History reviewed. No pertinent surgical history.  Family History  Problem Relation Age of Onset   Emphysema Mother        smoked   Asthma Brother      Social Connections: Not on file      Current Outpatient Medications:    albuterol  (VENTOLIN  HFA) 108 (90 Base) MCG/ACT inhaler, Inhale 2 puffs into the lungs every 4 (four) hours as needed for wheezing or shortness of breath., Disp: 1 each, Rfl: 1   fluticasone  (FLONASE) 50 MCG/ACT nasal spray, 1 spray in each nostril Nasally Twice a day; Duration:  30 day(s), Disp: , Rfl:    mometasone -formoterol  (DULERA ) 200-5 MCG/ACT AERO, INHALE 2 PUFFS BY MOUTH INTO THE LUNGS FIRST THING IN THE MORNING AND TAKE 2 PUFFS ABOUT 12 HOURS LATER, Disp: 13 g, Rfl: 11   hydrOXYzine   (ATARAX /VISTARIL ) 50 MG tablet, Take 1 tablet (50 mg total) by mouth every 8 (eight) hours as needed for anxiety. (Patient not taking: Reported on 06/16/2024), Disp: 21 tablet, Rfl: 0   traZODone  (DESYREL ) 50 MG tablet, Take 1 tablet (50 mg total) by mouth at bedtime. (Patient not taking: Reported on 06/16/2024), Disp: 10 tablet, Rfl: 0   Physical Exam:   BP 118/78 (BP Location: Right Arm, Patient Position: Sitting, Cuff Size: Normal)   Pulse 71   Ht 5' 11 (1.803 m)   Wt 160 lb (72.6 kg)   SpO2 97%   BMI 22.32 kg/m   Salient findings:  CN II-XII intact Given history and complaints, ear microscopy was indicated and performed for evaluation with findings as below in physical exam section and in procedures; Bilateral EAC clear and TM intact with well pneumatized middle ear spaces Anterior rhinoscopy: Septum relatively midline; bilateral inferior turbinates without significant hypertrophy No lesions of oral cavity/oropharynx; dentition good; no tongue masses No respiratory distress or stridor  Seprately Identifiable Procedures:   Procedure: Bilateral ear microscopy using microscope (CPT 92504) Pre-procedure diagnosis:  Unilateral tinnitus rule out mass Post-procedure diagnosis: same Indication: see above; given patient's otologic complaints and history, for improved and comprehensive examination of external ear and tympanic membrane, bilateral otologic examination using microscope was performed. Prior to proceeding, verbal consent was obtained after discussion of R/B/A  Procedure: Patient was placed semi-recumbent. Both ear canals were examined using the microscope with findings above. Patient tolerated the procedure well.   Electronically signed by: Eldora KATHEE Blanch, MD 06/16/2024 9:41 AM   Impression & Plans:  Shawn Marquez is a 38 y.o. male with:  1. Tongue papillae hypertrophy   2. Abnormal auditory perception of left ear   3. Normal hearing test of both ears   4. Tinnitus of left  ear    Noted two month history of posterior tongue/tongue base lesion; likely papillae prominence; symptoms resolved From tinnitus standpoint, given audio, most likely due to HF SNHL AS. Discussed mgmt options including masking, lipoflavonoids, tinnitus retraining. Will try lipoflavonoids and masking, deferred retraining.  F/u offered in 1 year, opted PRN; advised to call us  if something changes   See below regarding exact medications prescribed this encounter including dosages and route: No orders of the defined types were placed in this encounter.     Thank you for allowing me the opportunity to care for your patient. Please do not hesitate to contact me should you have any other questions.  Sincerely, Eldora Blanch, MD Otolaryngologist (ENT), [facility] Phone: [phone] Fax: [phone]  06/16/2024, 9:41 AM   MDM:  Level 3: 99213 Complexity/Problems addressed: low Data complexity: low - review of notes, review of test - Morbidity: low  - Prescription Drug prescribed or managed: no
